# Patient Record
Sex: Female | Born: 1945 | Race: White | Hispanic: No | Marital: Single | Smoking: Never smoker
Health system: Southern US, Community
[De-identification: ages and names within clinical notes are randomized; demographics above are authoritative.]

## PROBLEM LIST (undated history)

## (undated) DIAGNOSIS — I69851 Hemiplegia and hemiparesis following other cerebrovascular disease affecting right dominant side: Secondary | ICD-10-CM

## (undated) DIAGNOSIS — R2681 Unsteadiness on feet: Secondary | ICD-10-CM

## (undated) DIAGNOSIS — R569 Unspecified convulsions: Secondary | ICD-10-CM

## (undated) DIAGNOSIS — Z87898 Personal history of other specified conditions: Secondary | ICD-10-CM

## (undated) DIAGNOSIS — R296 Repeated falls: Secondary | ICD-10-CM

## (undated) HISTORY — PX: APPENDECTOMY: SHX54

## (undated) HISTORY — PX: CHOLECYSTECTOMY: SHX55

---

## 2012-05-05 ENCOUNTER — Ambulatory Visit: Payer: Self-pay

## 2017-07-01 ENCOUNTER — Other Ambulatory Visit: Payer: Self-pay

## 2017-07-01 ENCOUNTER — Inpatient Hospital Stay
Admission: EM | Admit: 2017-07-01 | Discharge: 2017-07-05 | DRG: 872 | Disposition: A | Discharge status: Skilled Nursing Facility | Payer: Medicare Other | Attending: Internal Medicine | Admitting: Internal Medicine

## 2017-07-01 ENCOUNTER — Emergency Department: Payer: Medicare Other

## 2017-07-01 ENCOUNTER — Encounter: Payer: Self-pay | Admitting: *Deleted

## 2017-07-01 DIAGNOSIS — R319 Hematuria, unspecified: Secondary | ICD-10-CM | POA: Diagnosis present

## 2017-07-01 DIAGNOSIS — E669 Obesity, unspecified: Secondary | ICD-10-CM | POA: Diagnosis present

## 2017-07-01 DIAGNOSIS — E860 Dehydration: Secondary | ICD-10-CM | POA: Diagnosis not present

## 2017-07-01 DIAGNOSIS — Z7401 Bed confinement status: Secondary | ICD-10-CM

## 2017-07-01 DIAGNOSIS — Z880 Allergy status to penicillin: Secondary | ICD-10-CM | POA: Diagnosis not present

## 2017-07-01 DIAGNOSIS — N390 Urinary tract infection, site not specified: Secondary | ICD-10-CM

## 2017-07-01 DIAGNOSIS — R748 Abnormal levels of other serum enzymes: Secondary | ICD-10-CM | POA: Diagnosis present

## 2017-07-01 DIAGNOSIS — B964 Proteus (mirabilis) (morganii) as the cause of diseases classified elsewhere: Secondary | ICD-10-CM | POA: Diagnosis present

## 2017-07-01 DIAGNOSIS — A419 Sepsis, unspecified organism: Principal | ICD-10-CM | POA: Diagnosis present

## 2017-07-01 DIAGNOSIS — R05 Cough: Secondary | ICD-10-CM

## 2017-07-01 DIAGNOSIS — E871 Hypo-osmolality and hyponatremia: Secondary | ICD-10-CM

## 2017-07-01 DIAGNOSIS — N289 Disorder of kidney and ureter, unspecified: Secondary | ICD-10-CM

## 2017-07-01 DIAGNOSIS — Z91041 Radiographic dye allergy status: Secondary | ICD-10-CM

## 2017-07-01 DIAGNOSIS — Z9049 Acquired absence of other specified parts of digestive tract: Secondary | ICD-10-CM | POA: Diagnosis not present

## 2017-07-01 DIAGNOSIS — R059 Cough, unspecified: Secondary | ICD-10-CM

## 2017-07-01 DIAGNOSIS — E876 Hypokalemia: Secondary | ICD-10-CM | POA: Diagnosis present

## 2017-07-01 DIAGNOSIS — Z79899 Other long term (current) drug therapy: Secondary | ICD-10-CM

## 2017-07-01 DIAGNOSIS — I69851 Hemiplegia and hemiparesis following other cerebrovascular disease affecting right dominant side: Secondary | ICD-10-CM | POA: Diagnosis not present

## 2017-07-01 DIAGNOSIS — Z888 Allergy status to other drugs, medicaments and biological substances status: Secondary | ICD-10-CM

## 2017-07-01 DIAGNOSIS — R569 Unspecified convulsions: Secondary | ICD-10-CM | POA: Diagnosis present

## 2017-07-01 DIAGNOSIS — Z6837 Body mass index (BMI) 37.0-37.9, adult: Secondary | ICD-10-CM

## 2017-07-01 DIAGNOSIS — Z881 Allergy status to other antibiotic agents status: Secondary | ICD-10-CM | POA: Diagnosis not present

## 2017-07-01 HISTORY — DX: Repeated falls: R29.6

## 2017-07-01 HISTORY — DX: Personal history of other specified conditions: Z87.898

## 2017-07-01 HISTORY — DX: Unsteadiness on feet: R26.81

## 2017-07-01 HISTORY — DX: Hemiplegia and hemiparesis following other cerebrovascular disease affecting right dominant side: I69.851

## 2017-07-01 HISTORY — DX: Unspecified convulsions: R56.9

## 2017-07-01 LAB — COMPREHENSIVE METABOLIC PANEL
ALK PHOS: 170 U/L — AB (ref 38–126)
ALT: 13 U/L — AB (ref 14–54)
ANION GAP: 18 — AB (ref 5–15)
AST: 29 U/L (ref 15–41)
Albumin: 3 g/dL — ABNORMAL LOW (ref 3.5–5.0)
BUN: 32 mg/dL — ABNORMAL HIGH (ref 6–20)
CALCIUM: 8.7 mg/dL — AB (ref 8.9–10.3)
CHLORIDE: 82 mmol/L — AB (ref 101–111)
CO2: 31 mmol/L (ref 22–32)
CREATININE: 1.29 mg/dL — AB (ref 0.44–1.00)
GFR calc non Af Amer: 41 mL/min — ABNORMAL LOW (ref >60)
GFR, EST AFRICAN AMERICAN: 48 mL/min — AB (ref >60)
Glucose, Bld: 99 mg/dL (ref 65–99)
Potassium: 3 mmol/L — ABNORMAL LOW (ref 3.5–5.1)
SODIUM: 131 mmol/L — AB (ref 135–145)
Total Bilirubin: 1.3 mg/dL — ABNORMAL HIGH (ref 0.3–1.2)
Total Protein: 8.1 g/dL (ref 6.5–8.1)

## 2017-07-01 LAB — URINALYSIS, ROUTINE W REFLEX MICROSCOPIC
Bilirubin Urine: NEGATIVE
Glucose, UA: NEGATIVE mg/dL
KETONES UR: NEGATIVE mg/dL
NITRITE: POSITIVE — AB
PH: 8 (ref 5.0–8.0)
Protein, ur: NEGATIVE mg/dL
SPECIFIC GRAVITY, URINE: 1.006 (ref 1.005–1.030)

## 2017-07-01 LAB — CBC WITH DIFFERENTIAL/PLATELET
BASOS PCT: 0 %
Basophils Absolute: 0.1 10*3/uL (ref 0–0.1)
EOS ABS: 0.1 10*3/uL (ref 0–0.7)
Eosinophils Relative: 1 %
HCT: 42.7 % (ref 35.0–47.0)
HEMOGLOBIN: 14.4 g/dL (ref 12.0–16.0)
LYMPHS ABS: 1.5 10*3/uL (ref 1.0–3.6)
Lymphocytes Relative: 8 %
MCH: 31.8 pg (ref 26.0–34.0)
MCHC: 33.6 g/dL (ref 32.0–36.0)
MCV: 94.6 fL (ref 80.0–100.0)
MONO ABS: 1.7 10*3/uL — AB (ref 0.2–0.9)
MONOS PCT: 9 %
Neutro Abs: 16.2 10*3/uL — ABNORMAL HIGH (ref 1.4–6.5)
Neutrophils Relative %: 82 %
Platelets: 281 10*3/uL (ref 150–440)
RBC: 4.52 MIL/uL (ref 3.80–5.20)
RDW: 13.4 % (ref 11.5–14.5)
WBC: 19.6 10*3/uL — ABNORMAL HIGH (ref 3.6–11.0)

## 2017-07-01 LAB — TROPONIN I: Troponin I: <0.03 ng/mL (ref <0.03)

## 2017-07-01 LAB — LACTIC ACID, PLASMA
LACTIC ACID, VENOUS: 1.4 mmol/L (ref 0.5–1.9)
Lactic Acid, Venous: 1.8 mmol/L (ref 0.5–1.9)

## 2017-07-01 LAB — LIPASE, BLOOD: LIPASE: 65 U/L — AB (ref 11–51)

## 2017-07-01 MED ORDER — SODIUM CHLORIDE 0.9 % IV SOLN
INTRAVENOUS | Status: DC
  Administered 2017-07-02 (×2): via INTRAVENOUS

## 2017-07-01 MED ORDER — ACETAMINOPHEN 325 MG PO TABS
650.0000 mg | ORAL_TABLET | Freq: Four times a day (QID) | ORAL | Status: DC | PRN
  Administered 2017-07-03: 650 mg via ORAL
  Filled 2017-07-01: qty 2

## 2017-07-01 MED ORDER — MECLIZINE HCL 25 MG PO TABS
25.0000 mg | ORAL_TABLET | Freq: Three times a day (TID) | ORAL | Status: DC | PRN
  Filled 2017-07-01: qty 1

## 2017-07-01 MED ORDER — CARBAMAZEPINE 200 MG PO TABS: 200.0000 mg | ORAL_TABLET | Freq: Three times a day (TID) | ORAL | Status: DC

## 2017-07-01 MED ORDER — ONDANSETRON HCL 4 MG/2ML IJ SOLN
4.0000 mg | Freq: Four times a day (QID) | INTRAMUSCULAR | Status: DC | PRN
  Administered 2017-07-02: 4 mg via INTRAVENOUS
  Filled 2017-07-01: qty 2

## 2017-07-01 MED ORDER — FAMOTIDINE 20 MG PO TABS: 40.0000 mg | ORAL_TABLET | Freq: Two times a day (BID) | ORAL | Status: DC

## 2017-07-01 MED ORDER — PHENOBARBITAL 20 MG/5ML PO ELIX: 60.0000 mg | ORAL_SOLUTION | Freq: Two times a day (BID) | ORAL | Status: DC

## 2017-07-01 MED ORDER — PHENOBARBITAL 60 MG PO TABS: 60.0000 mg | ORAL_TABLET | Freq: Two times a day (BID) | ORAL | Status: DC

## 2017-07-01 MED ORDER — HYDROCODONE-ACETAMINOPHEN 5-325 MG PO TABS: 1.0000 | ORAL_TABLET | ORAL | Status: DC | PRN

## 2017-07-01 MED ORDER — CARBAMAZEPINE 200 MG PO TABS
400.0000 mg | ORAL_TABLET | Freq: Every day | ORAL | Status: DC
  Administered 2017-07-02 – 2017-07-04 (×4): 400 mg via ORAL
  Filled 2017-07-01 (×6): qty 2

## 2017-07-01 MED ORDER — IOPAMIDOL (ISOVUE-300) INJECTION 61%: 75.0000 mL | Freq: Once | INTRAVENOUS | Status: DC | PRN

## 2017-07-01 MED ORDER — ALBUTEROL SULFATE (2.5 MG/3ML) 0.083% IN NEBU
2.5000 mg | INHALATION_SOLUTION | RESPIRATORY_TRACT | Status: DC | PRN
  Administered 2017-07-05: 2.5 mg via RESPIRATORY_TRACT
  Filled 2017-07-01: qty 3

## 2017-07-01 MED ORDER — ACETAMINOPHEN 650 MG RE SUPP: 650.0000 mg | Freq: Four times a day (QID) | RECTAL | Status: DC | PRN

## 2017-07-01 MED ORDER — SODIUM CHLORIDE 0.9 % IV SOLN
2.0000 g | Freq: Once | INTRAVENOUS | Status: AC
  Administered 2017-07-01: 2 g via INTRAVENOUS
  Filled 2017-07-01: qty 2

## 2017-07-01 MED ORDER — CARBAMAZEPINE 200 MG PO TABS
200.0000 mg | ORAL_TABLET | Freq: Two times a day (BID) | ORAL | Status: DC
  Administered 2017-07-02 – 2017-07-05 (×8): 200 mg via ORAL
  Filled 2017-07-01 (×8): qty 1

## 2017-07-01 MED ORDER — BISACODYL 5 MG PO TBEC: 5.0000 mg | DELAYED_RELEASE_TABLET | Freq: Every day | ORAL | Status: DC | PRN

## 2017-07-01 MED ORDER — SODIUM CHLORIDE 0.9 % IV SOLN
1.0000 g | Freq: Three times a day (TID) | INTRAVENOUS | Status: DC
  Administered 2017-07-02 – 2017-07-03 (×5): 1 g via INTRAVENOUS
  Filled 2017-07-01 (×6): qty 1

## 2017-07-01 MED ORDER — SODIUM CHLORIDE 0.9 % IV BOLUS (SEPSIS)
1000.0000 mL | INTRAVENOUS | Status: AC
  Administered 2017-07-01: 1000 mL via INTRAVENOUS

## 2017-07-01 MED ORDER — PROMETHAZINE HCL 25 MG PO TABS
25.0000 mg | ORAL_TABLET | Freq: Four times a day (QID) | ORAL | Status: DC | PRN
  Filled 2017-07-01: qty 1

## 2017-07-01 MED ORDER — SENNOSIDES-DOCUSATE SODIUM 8.6-50 MG PO TABS: 1.0000 | ORAL_TABLET | Freq: Every evening | ORAL | Status: DC | PRN

## 2017-07-01 MED ORDER — ONDANSETRON HCL 4 MG/2ML IJ SOLN
4.0000 mg | INTRAMUSCULAR | Status: AC
  Filled 2017-07-01: qty 2

## 2017-07-01 MED ORDER — VITAMIN B-12 1000 MCG PO TABS
500.0000 ug | ORAL_TABLET | Freq: Every day | ORAL | Status: DC
  Administered 2017-07-02 – 2017-07-05 (×4): 500 ug via ORAL
  Filled 2017-07-01 (×4): qty 1

## 2017-07-01 MED ORDER — LORATADINE 10 MG PO TABS: 10.0000 mg | ORAL_TABLET | Freq: Every day | ORAL | Status: DC | PRN

## 2017-07-01 MED ORDER — ENOXAPARIN SODIUM 40 MG/0.4ML ~~LOC~~ SOLN
40.0000 mg | SUBCUTANEOUS | Status: DC
  Administered 2017-07-02 – 2017-07-04 (×4): 40 mg via SUBCUTANEOUS
  Filled 2017-07-01 (×4): qty 0.4

## 2017-07-01 MED ORDER — PHENOBARBITAL 60 MG/ML ORAL SUSPENSION: 60.0000 mg | Freq: Two times a day (BID) | ORAL | Status: DC

## 2017-07-01 MED ORDER — ONDANSETRON HCL 4 MG PO TABS
4.0000 mg | ORAL_TABLET | Freq: Four times a day (QID) | ORAL | Status: DC | PRN
  Filled 2017-07-01: qty 1

## 2017-07-01 MED ORDER — GUAIFENESIN 100 MG/5ML PO SOLN
100.0000 mg | ORAL | Status: DC | PRN
  Administered 2017-07-05: 100 mg via ORAL
  Filled 2017-07-01 (×2): qty 5

## 2017-07-01 MED ORDER — POTASSIUM CHLORIDE CRYS ER 20 MEQ PO TBCR
20.0000 meq | EXTENDED_RELEASE_TABLET | Freq: Every day | ORAL | Status: DC
  Administered 2017-07-02 – 2017-07-05 (×4): 20 meq via ORAL
  Filled 2017-07-01 (×4): qty 1

## 2017-07-01 MED ORDER — FOLIC ACID 1 MG PO TABS
1.0000 mg | ORAL_TABLET | Freq: Every day | ORAL | Status: DC
  Administered 2017-07-02 – 2017-07-05 (×4): 1 mg via ORAL
  Filled 2017-07-01 (×4): qty 1

## 2017-07-01 MED ORDER — FAMOTIDINE 20 MG PO TABS
20.0000 mg | ORAL_TABLET | Freq: Two times a day (BID) | ORAL | Status: DC
  Administered 2017-07-02 – 2017-07-05 (×7): 20 mg via ORAL
  Filled 2017-07-01 (×8): qty 1

## 2017-07-01 MED ORDER — LOPERAMIDE HCL 2 MG PO CAPS
2.0000 mg | ORAL_CAPSULE | ORAL | Status: DC | PRN
  Filled 2017-07-01: qty 1

## 2017-07-01 NOTE — ED Provider Notes (Signed)
[facility] Emergency Department Provider Note  ____________________________________________   First MD Initiated Contact with Patient 07/01/17 1439     (approximate)  I have reviewed the triage vital signs and the nursing notes.   HISTORY  Chief Complaint Emesis  Level 5 caveat:  history/ROS limited by acute/critical illness  HPI Sherri Sweeney is a 72 y.o. female with medical history as listed below who is chronically ill and lives at [facility] who presents by EMS for evaluation of possible dehydration and possible urinary tract infection.  Reportedly she has had gradually worsening symptoms over the last week that include burning with urination, increased lethargy, increased generalized weakness, inability to get out of bed even with a lift, and altered mental status with increased confusion.  She also reports that she generalized abdominal pain and occasionally has shortness of breath.  She and her family, who provide most of the history, reports that the symptoms are severe and nothing is making them better or worse.  She says that she feels terrible all over but is unable to describe any other symptoms except as described above.  There is no report of fever, chest pain, nor shortness of breath except with exertion.   Past Medical History:  Diagnosis Date  . Falling   . 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 History of convulsions   . Unspecified convulsions (HCC)   . Unsteadiness on feet     Patient Active Problem List   Diagnosis Date Noted  . Sepsis (HCC) 07/01/2017    Past Surgical History:  Procedure Laterality Date  . APPENDECTOMY    . CHOLECYSTECTOMY      Prior to Admission medications   Medication Sig Start Date End Date Taking? Authorizing Provider  acetaminophen (TYLENOL) 500 MG tablet Take 1,000 mg by mouth every 8 (eight) hours as needed.   Yes [provider]  carbamazepine (TEGRETOL) 200 MG  tablet Take 200-400 mg by mouth 3 (three) times daily. Take 200 mg by mouth morning and lunch. Take 400 mg by mouth at bedtime.   Yes [provider]  Cholecalciferol (D3-1000 PO) Take 1,000 Units by mouth daily.   Yes [provider]  folic acid (FOLVITE) 1 MG tablet Take 1 mg by mouth daily.   Yes [provider]  guaiFENesin (MUCINEX) 600 MG 12 hr tablet Take 600 mg by mouth 2 (two) times daily as needed (congestion).   Yes [provider]  guaiFENesin (ROBITUSSIN) 100 MG/5ML liquid Take 100 mg by mouth every 4 (four) hours as needed for cough.   Yes [provider]  loperamide (IMODIUM) 2 MG capsule Take 2 mg by mouth as needed for diarrhea or loose stools.   Yes [provider]  loratadine (CLARITIN) 10 MG tablet Take 10 mg by mouth daily as needed for allergies.   Yes [provider]  meclizine (ANTIVERT) 25 MG tablet Take 25 mg by mouth 3 (three) times daily as needed for dizziness.   Yes [provider]  ondansetron (ZOFRAN) 4 MG tablet Take 4 mg by mouth every 6 (six) hours as needed for nausea or vomiting.   Yes [provider]  PHENObarbital (LUMINAL) 60 MG tablet Take 60 mg by mouth 2 (two) times daily.   Yes [provider]  potassium chloride SA (K-DUR,KLOR-CON) 20 MEQ tablet Take 20 mEq by mouth daily.   Yes [provider]  promethazine (PHENERGAN) 25 MG tablet Take 25  mg by mouth every 6 (six) hours as needed for nausea or vomiting.   Yes [provider]  ranitidine (ZANTAC) 300 MG tablet Take 300 mg by mouth 2 (two) times daily.   Yes [provider]  torsemide (DEMADEX) 100 MG tablet Take 100 mg by mouth daily.   Yes [provider]  vitamin B-12 (CYANOCOBALAMIN) 500 MCG tablet Take 500 mcg by mouth daily.   Yes [provider]    Allergies Ampicillin; Ceftin [cefuroxime axetil]; Contrast media [iodinated diagnostic agents]; Keflex [cephalexin];  Levaquin [levofloxacin in d5w]; Nitrofurantoin; Septra [sulfamethoxazole-trimethoprim]; Vancomycin; and Penicillins  No family history on file.  Social History Social History   Tobacco Use  . Smoking status: Never Smoker  . Smokeless tobacco: Never Used  Substance Use Topics  . Alcohol use: No    Frequency: Never  . Drug use: No    Review of Systems Level 5 caveat:  history/ROS limited by acute/critical illness and confusion ____________________________________________   PHYSICAL EXAM:  VITAL SIGNS: ED Triage Vitals  Enc Vitals Group     BP 07/01/17 1357 120/77     Pulse Rate 07/01/17 1357 (!) 102     Resp 07/01/17 1357 18     Temp 07/01/17 1357 97.7 F (36.5 C)     Temp Source 07/01/17 1357 Oral     SpO2 07/01/17 1357 95 %     Weight 07/01/17 1358 103.9 kg (229 lb 0.9 oz)     Height --      Head Circumference --      Peak Flow --      Pain Score --      Pain Loc --      Pain Edu? --      Excl. in GC? --     Constitutional: Alert but appears chronically ill, possibly acutely as well, if occult to appreciate the degree of acute on chronic symptoms upon initial evaluation Eyes: Conjunctivae are normal.  Head: Atraumatic. Nose: No congestion/rhinnorhea. Mouth/Throat: Mucous membranes are dry Neck: No stridor.  No meningeal signs.   Cardiovascular: Mild tachycardia, regular rhythm. Good peripheral circulation. Grossly normal heart sounds. Respiratory: Normal respiratory effort.  No retractions. Lungs CTAB. Gastrointestinal: Morbid obesity.  Soft but moderate tenderness throughout the abdomen, possibly some distention but difficult to appreciate given body habitus.  No guarding, questionable rebound Musculoskeletal: No lower extremity tenderness nor edema. No gross deformities of extremities. Neurologic: No acute facial droop.  Chronic right-sided hemiplegia about left side is moving normally. Skin:  Skin is pale, warm, dry and intact. No rash noted. Psychiatric:  Mood and affect are normal. Speech and behavior are normal.  ____________________________________________   LABS (all labs ordered are listed, but only abnormal results are displayed)  Labs Reviewed  CBC WITH DIFFERENTIAL/PLATELET - Abnormal; Notable for the following components:      Result Value   WBC 19.6 (*)    Neutro Abs 16.2 (*)    Monocytes Absolute 1.7 (*)    All other components within normal limits  LIPASE, BLOOD - Abnormal; Notable for the following components:   Lipase 65 (*)    All other components within normal limits  COMPREHENSIVE METABOLIC PANEL - Abnormal; Notable for the following components:   Sodium 131 (*)    Potassium 3.0 (*)    Chloride 82 (*)    BUN 32 (*)    Creatinine, Ser 1.29 (*)    Calcium 8.7 (*)    Albumin 3.0 (*)    ALT  13 (*)    Alkaline Phosphatase 170 (*)    Total Bilirubin 1.3 (*)    GFR calc non Af Amer 41 (*)    GFR calc Af Amer 48 (*)    Anion gap 18 (*)    All other components within normal limits  URINALYSIS, ROUTINE W REFLEX MICROSCOPIC - Abnormal; Notable for the following components:   Color, Urine YELLOW (*)    APPearance TURBID (*)    Hgb urine dipstick SMALL (*)    Nitrite POSITIVE (*)    Leukocytes, UA LARGE (*)    Bacteria, UA MANY (*)    Squamous Epithelial / LPF 6-30 (*)    All other components within normal limits  URINE CULTURE  TROPONIN I  LACTIC ACID, PLASMA  LACTIC ACID, PLASMA   ____________________________________________  EKG  ED ECG REPORT I, Loleta Rose, the attending physician, personally viewed and interpreted this ECG.  Date: 07/01/2017 EKG Time: 16:45 Rate: 102 Rhythm: borderline tachycardia QRS Axis: normal Intervals: RBBB ST/T Wave abnormalities: Non-specific ST segment / T-wave changes, but no evidence of acute ischemia. Narrative Interpretation: no evidence of acute ischemia   ____________________________________________  RADIOLOGY   ED MD interpretation:  No acute abnormalities.  Tiny renal stone not concerning even in the setting of UTI  Official radiology report(s): Ct Abdomen Pelvis Wo Contrast  Result Date: 07/01/2017 CLINICAL DATA:  Dehydration and difficulty urinating EXAM: CT ABDOMEN AND PELVIS WITHOUT CONTRAST TECHNIQUE: Multidetector CT imaging of the abdomen and pelvis was performed following the standard protocol without IV contrast. COMPARISON:  None. FINDINGS: Lower chest: No acute abnormality. Hepatobiliary: Fatty infiltration of the liver. Status post cholecystectomy. No biliary dilatation. Pancreas: Fatty infiltration of the pancreas is seen. No inflammatory changes are noted. Spleen: Normal in size without focal abnormality. Adrenals/Urinary Tract: Adrenal glands are within normal limits. Kidneys are well visualized bilaterally. Tiny nonobstructing right upper pole renal stone is noted. The bladder is within normal limits. Stomach/Bowel: Appendix is within normal limits. No obstructive or inflammatory changes of the bowel are seen. Vascular/Lymphatic: Aortic atherosclerosis. No enlarged abdominal or pelvic lymph nodes. Reproductive: Uterus and bilateral adnexa are unremarkable. Other: No abdominal wall hernia or abnormality. No abdominopelvic ascites. Musculoskeletal: Degenerative changes of the lumbar spine are noted. IMPRESSION: Tiny nonobstructing right renal stone Chronic changes in the liver and pancreas. No acute abnormality noted. Electronically Signed   By: Alcide Clever M.D.   On: 07/01/2017 16:47    ____________________________________________   PROCEDURES  Critical Care performed: No   Procedure(s) performed:   Procedures   ____________________________________________   INITIAL IMPRESSION / ASSESSMENT AND PLAN / ED COURSE  As part of my medical decision making, I reviewed the following data within the electronic MEDICAL RECORD NUMBER Nursing notes reviewed and incorporated, Labs reviewed , EKG interpreted  and Discussed with admitting physician      Differential diagnosis includes, but is not limited to, acute infection including viral respiratory illness, pneumonia, intra-abdominal infection, urinary tract infection; generalized dehydration and failure to thrive; CVA less likely given the current presentation; ACS.  The patient's presentation looks most consistent with urinary tract infection with the possibility of additional intra-abdominal infection as well.  She reports an allergy to IV contrast so I am obtaining a CT scan of this with no contrast.  Lab work is pending.  Based on the appearance of the patient and the description of her symptoms I suspect she will but I will reassess after the workup.  Clinical Course as of  Jul 01 2105  Mon Jul 01, 2017  1602 Anion gap: (!) 18 [CF]  1602 Creatinine: (!) 1.29 [CF]  1603 Potassium: (!) 3.0 [CF]  1603 Lipase: (!) 65 [CF]  1603 WBC: (!) 19.6 [CF]  1731 Lactic Acid, Venous: 1.8 [CF]  1731 No acute abnormality on CT abd/pelvis.  Awaiting UA CT ABDOMEN PELVIS WO CONTRAST [CF]  1919 Grossly infected nitrite positive urine with hematuria.  Again, CT was reassuring.  Extensive antibiotic allergies.  Will treat with PCN-allergic alternative regiment and admit given AMS, leukocytosis, failure to thrive, etc  [CF]  1930 Updated patient and family.  Discussed case by phone with Dr. Imogene Burn who will admit.  [CF]    Clinical Course User Index [CF] Loleta Rose, MD    ____________________________________________  FINAL CLINICAL IMPRESSION(S) / ED DIAGNOSES  Final diagnoses:  Urinary tract infection with hematuria, site unspecified  Hypokalemia  Acute renal insufficiency  Dehydration  Elevated lipase  Hyponatremia     MEDICATIONS GIVEN DURING THIS VISIT:  Medications  ondansetron (ZOFRAN) injection 4 mg (4 mg Intravenous Refused 07/01/17 1515)  aztreonam (AZACTAM) 1 g in sodium chloride 0.9 % 100 mL IVPB (not administered)  sodium chloride 0.9 % bolus 1,000 mL (0 mLs Intravenous  Stopped 07/01/17 1933)  aztreonam (AZACTAM) 2 g in sodium chloride 0.9 % 100 mL IVPB (0 g Intravenous Stopped 07/01/17 2047)     ED Discharge Orders    None       Note:  This document was prepared using Dragon voice recognition software and may include unintentional dictation errors.    Loleta Rose, MD 07/01/17 2108

## 2017-07-01 NOTE — H&P (Signed)
[employer] [facility] [place] [facility]   PATIENT NAME: Sherri Sweeney    MR#:  [id number]  DATE OF BIRTH:  [birth year]  DATE OF ADMISSION:  07/01/2017  PRIMARY CARE PHYSICIAN: Dortha Kern, MD   REQUESTING/REFERRING PHYSICIAN: Dr. York Cerise.  CHIEF COMPLAINT:   Chief Complaint  Patient presents with  . Emesis   Dysuria, lethargy and generalized weakness for the past week HISTORY OF PRESENT ILLNESS:  Sherri Sweeney  is a 72 y.o. female with a known history of convulsions, hemiplegia and fall.  The patient was sent from nursing home to the ED due to above chief complaints.  She is found tachycardia, leukocytosis and UTI in the ED.  Since she has allergy to multiple antibiotics, azactam is started.  PAST MEDICAL HISTORY:   Past Medical History:  Diagnosis Date  . Falling   . 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 History of convulsions   . Unspecified convulsions (HCC)   . Unsteadiness on feet     PAST SURGICAL HISTORY:   Past Surgical History:  Procedure Laterality Date  . APPENDECTOMY    . CHOLECYSTECTOMY      SOCIAL HISTORY:   Social History   Tobacco Use  . Smoking status: Never Smoker  . Smokeless tobacco: Never Used  Substance Use Topics  . Alcohol use: No    Frequency: Never    FAMILY HISTORY:  No family history on file.  DRUG ALLERGIES:   Allergies  Allergen Reactions  . Ampicillin   . Ceftin [Cefuroxime Axetil]   . Contrast Media [Iodinated Diagnostic Agents] Itching  . Keflex [Cephalexin]   . Levaquin [Levofloxacin In D5w]   . Nitrofurantoin   . Septra [Sulfamethoxazole-Trimethoprim]   . Vancomycin   . Penicillins Itching and Rash    REVIEW OF SYSTEMS:   Review of Systems  Constitutional: Positive for malaise/fatigue. Negative for chills and fever.  HENT: Negative for sore throat.   Eyes: Negative for blurred vision and double vision.  Respiratory: Negative for cough, hemoptysis, shortness of  breath, wheezing and stridor.   Cardiovascular: Negative for chest pain, palpitations, orthopnea and leg swelling.  Gastrointestinal: Negative for abdominal pain, blood in stool, diarrhea, melena, nausea and vomiting.  Genitourinary: Positive for dysuria and frequency. Negative for flank pain and hematuria.  Musculoskeletal: Negative for back pain and joint pain.  Neurological: Positive for weakness. Negative for dizziness, sensory change, focal weakness, seizures, loss of consciousness and headaches.  Endo/Heme/Allergies: Negative for polydipsia.  Psychiatric/Behavioral: Negative for depression. The patient is not nervous/anxious.     MEDICATIONS AT HOME:   Prior to Admission medications   Medication Sig Start Date End Date Taking? Authorizing Provider  acetaminophen (TYLENOL) 500 MG tablet Take 1,000 mg by mouth every 8 (eight) hours as needed.   Yes [provider]  carbamazepine (TEGRETOL) 200 MG tablet Take 200-400 mg by mouth 3 (three) times daily. Take 200 mg by mouth morning and lunch. Take 400 mg by mouth at bedtime.   Yes [provider]  Cholecalciferol (D3-1000 PO) Take 1,000 Units by mouth daily.   Yes [provider]  folic acid (FOLVITE) 1 MG tablet Take 1 mg by mouth daily.   Yes [provider]  guaiFENesin (MUCINEX) 600 MG 12 hr tablet Take 600 mg by mouth 2 (two) times daily as needed (congestion).   Yes [provider]  guaiFENesin (ROBITUSSIN) 100 MG/5ML liquid Take 100 mg by mouth every 4 (  four) hours as needed for cough.   Yes [provider]  loperamide (IMODIUM) 2 MG capsule Take 2 mg by mouth as needed for diarrhea or loose stools.   Yes [provider]  loratadine (CLARITIN) 10 MG tablet Take 10 mg by mouth daily as needed for allergies.   Yes [provider]  meclizine (ANTIVERT) 25 MG tablet Take 25 mg by mouth 3 (three) times daily as needed for dizziness.   Yes [provider]    ondansetron (ZOFRAN) 4 MG tablet Take 4 mg by mouth every 6 (six) hours as needed for nausea or vomiting.   Yes [provider]  PHENObarbital (LUMINAL) 60 MG tablet Take 60 mg by mouth 2 (two) times daily.   Yes [provider]  potassium chloride SA (K-DUR,KLOR-CON) 20 MEQ tablet Take 20 mEq by mouth daily.   Yes [provider]  promethazine (PHENERGAN) 25 MG tablet Take 25 mg by mouth every 6 (six) hours as needed for nausea or vomiting.   Yes [provider]  ranitidine (ZANTAC) 300 MG tablet Take 300 mg by mouth 2 (two) times daily.   Yes [provider]  torsemide (DEMADEX) 100 MG tablet Take 100 mg by mouth daily.   Yes [provider]  vitamin B-12 (CYANOCOBALAMIN) 500 MCG tablet Take 500 mcg by mouth daily.   Yes [provider]      VITAL SIGNS:  Blood pressure 117/73, pulse (!) 106, temperature 97.7 F (36.5 C), temperature source Oral, resp. rate 14, weight 229 lb 0.9 oz (103.9 kg), SpO2 96 %.  PHYSICAL EXAMINATION:  Physical Exam  GENERAL:  72 y.o.-year-old patient lying in the bed with no acute distress.  Obesity. EYES: Pupils equal, round, reactive to light and accommodation. No scleral icterus. Extraocular muscles intact.  HEENT: Head atraumatic, normocephalic. Oropharynx and nasopharynx clear.  NECK:  Supple, no jugular venous distention. No thyroid enlargement, no tenderness.  LUNGS: Normal breath sounds bilaterally, no wheezing, rales,rhonchi or crepitation. No use of accessory muscles of respiration.  CARDIOVASCULAR: S1, S2 normal. No murmurs, rubs, or gallops.  ABDOMEN: Soft, tenderness in the lower abdomen, nondistended. Bowel sounds present. No organomegaly or mass.  EXTREMITIES: No pedal edema, cyanosis, or clubbing.  NEUROLOGIC: Cranial nerves II through XII are intact. Muscle strength 3-4/5 in all extremities. Sensation intact. Gait not checked.  PSYCHIATRIC: The patient is alert and oriented x 2-3.   SKIN: No obvious rash, lesion, or ulcer.   LABORATORY PANEL:   CBC Recent Labs  Lab 07/01/17 1511  WBC 19.6*  HGB 14.4  HCT 42.7  PLT 281   ------------------------------------------------------------------------------------------------------------------  Chemistries  Recent Labs  Lab 07/01/17 1511  NA 131*  K 3.0*  CL 82*  CO2 31  GLUCOSE 99  BUN 32*  CREATININE 1.29*  CALCIUM 8.7*  AST 29  ALT 13*  ALKPHOS 170*  BILITOT 1.3*   ------------------------------------------------------------------------------------------------------------------  Cardiac Enzymes Recent Labs  Lab 07/01/17 1511  TROPONINI <0.03   ------------------------------------------------------------------------------------------------------------------  RADIOLOGY:  Ct Abdomen Pelvis Wo Contrast  Result Date: 07/01/2017 CLINICAL DATA:  Dehydration and difficulty urinating EXAM: CT ABDOMEN AND PELVIS WITHOUT CONTRAST TECHNIQUE: Multidetector CT imaging of the abdomen and pelvis was performed following the standard protocol without IV contrast. COMPARISON:  None. FINDINGS: Lower chest: No acute abnormality. Hepatobiliary: Fatty infiltration of the liver. Status post cholecystectomy. No biliary dilatation. Pancreas: Fatty infiltration of the pancreas is seen. No inflammatory changes are noted. Spleen: Normal in size without focal abnormality. Adrenals/Urinary  Tract: Adrenal glands are within normal limits. Kidneys are well visualized bilaterally. Tiny nonobstructing right upper pole renal stone is noted. The bladder is within normal limits. Stomach/Bowel: Appendix is within normal limits. No obstructive or inflammatory changes of the bowel are seen. Vascular/Lymphatic: Aortic atherosclerosis. No enlarged abdominal or pelvic lymph nodes. Reproductive: Uterus and bilateral adnexa are unremarkable. Other: No abdominal wall hernia or abnormality. No abdominopelvic ascites. Musculoskeletal: Degenerative changes  of the lumbar spine are noted. IMPRESSION: Tiny nonobstructing right renal stone Chronic changes in the liver and pancreas. No acute abnormality noted. Electronically Signed   By: Alcide Clever M.D.   On: 07/01/2017 16:47      IMPRESSION AND PLAN:   Sepsis due to UTI (tachycardia and leukocytosis) The patient will be admitted to medical floor. She has allergies to multiple antibiotics.  Start attacked him and ID consult. Follow-up CBC and cultures.  Acute renal failure with dehydration.  Start IV fluid support and follow-up BMP.  Hold torsemide.  History of convulsions.  Continue Tegretol. All the records are reviewed and case discussed with ED provider. Management plans discussed with the patient, her sister and they are in agreement.  CODE STATUS: Full code  TOTAL TIME TAKING CARE OF THIS PATIENT: 56 minutes.    Shaune Pollack M.D on 07/01/2017 at 7:51 PM  Between 7am to 6pm - Pager - [phone]  After 6pm go to [email or web address] - Social research officer, government  [facility] [place] [facility]  Office  [phone]  CC: Primary care physician; Dortha Kern, MD   Note: This dictation was prepared with Dragon dictation along with smaller phrase technology. Any transcriptional errors that result from this process are unin

## 2017-07-01 NOTE — ED Notes (Signed)
Dr. Chen at bedside.

## 2017-07-01 NOTE — ED Triage Notes (Signed)
Per EMS report, Patient c/o emesis for one week. Patient is a resident of [facility] and has had poor PO intake for 2-3 days.

## 2017-07-01 NOTE — ED Notes (Addendum)
Per patient's sister report, the nurse at [facility] sent the patient over for possible dehydration and burning with urination. Patient did not receive tegretol today, either generic or name brand. Family states patient hasn't been nauseous since Thursday. Patient placed on bed pan, but urinated in front of the pan. Dr. York CeriseForbach aware.

## 2017-07-01 NOTE — Progress Notes (Signed)
Pharmacy Antibiotic Note  Rudie Meyerancy L Dyas is a 72 y.o. female admitted on 07/01/2017 with UTI.  Pharmacy has been consulted for aztreonam dosing.  Plan: Aztreonam 2 g IV once in ED tonight. Will order aztreonam 1 g IV q8h  Weight: 229 lb 0.9 oz (103.9 kg)  Temp (24hrs), Avg:97.7 F (36.5 C), Min:97.7 F (36.5 C), Max:97.7 F (36.5 C)  Recent Labs  Lab 07/01/17 1511 07/01/17 1611  WBC 19.6*  --   CREATININE 1.29*  --   LATICACIDVEN  --  1.8    CrCl cannot be calculated (Unknown ideal weight.).    Allergies  Allergen Reactions  . Ampicillin   . Ceftin [Cefuroxime Axetil]   . Contrast Media [Iodinated Diagnostic Agents] Itching  . Keflex [Cephalexin]   . Levaquin [Levofloxacin In D5w]   . Nitrofurantoin   . Septra [Sulfamethoxazole-Trimethoprim]   . Vancomycin   . Penicillins Itching and Rash   Antimicrobials this admission: aztreonam 2/11 >>   Dose adjustments this admission:  Microbiology results: 2/11 UCx: Sent    Thank you for allowing pharmacy to be a part of this patient's care.  Cindi CarbonMary M Olamide Lahaie, PharmD, BCPS Clinical Pharmacist 07/01/2017 8:00 PM

## 2017-07-01 NOTE — ED Notes (Signed)
External female catheter placed prior to transfer to floor.

## 2017-07-02 ENCOUNTER — Other Ambulatory Visit: Payer: Self-pay

## 2017-07-02 LAB — BASIC METABOLIC PANEL
ANION GAP: 9 (ref 5–15)
BUN: 23 mg/dL — ABNORMAL HIGH (ref 6–20)
CO2: 29 mmol/L (ref 22–32)
Calcium: 7.6 mg/dL — ABNORMAL LOW (ref 8.9–10.3)
Chloride: 93 mmol/L — ABNORMAL LOW (ref 101–111)
Creatinine, Ser: 0.96 mg/dL (ref 0.44–1.00)
GFR calc Af Amer: >60 mL/min (ref >60)
GFR calc non Af Amer: 59 mL/min — ABNORMAL LOW (ref >60)
GLUCOSE: 92 mg/dL (ref 65–99)
POTASSIUM: 3.1 mmol/L — AB (ref 3.5–5.1)
Sodium: 131 mmol/L — ABNORMAL LOW (ref 135–145)

## 2017-07-02 LAB — CBC
HEMATOCRIT: 35.5 % (ref 35.0–47.0)
HEMOGLOBIN: 11.9 g/dL — AB (ref 12.0–16.0)
MCH: 31.8 pg (ref 26.0–34.0)
MCHC: 33.6 g/dL (ref 32.0–36.0)
MCV: 94.6 fL (ref 80.0–100.0)
Platelets: 196 10*3/uL (ref 150–440)
RBC: 3.75 MIL/uL — ABNORMAL LOW (ref 3.80–5.20)
RDW: 13.4 % (ref 11.5–14.5)
WBC: 10.2 10*3/uL (ref 3.6–11.0)

## 2017-07-02 LAB — APTT: APTT: 50 s — AB (ref 24–36)

## 2017-07-02 LAB — PROCALCITONIN

## 2017-07-02 LAB — PROTIME-INR
INR: 1.34
PROTHROMBIN TIME: 16.5 s — AB (ref 11.4–15.2)

## 2017-07-02 LAB — MAGNESIUM: Magnesium: 1.9 mg/dL (ref 1.7–2.4)

## 2017-07-02 LAB — MRSA PCR SCREENING: MRSA BY PCR: NEGATIVE

## 2017-07-02 MED ORDER — POTASSIUM CHLORIDE IN NACL 20-0.9 MEQ/L-% IV SOLN
INTRAVENOUS | Status: DC
  Administered 2017-07-02 – 2017-07-04 (×5): via INTRAVENOUS
  Filled 2017-07-02 (×8): qty 1000

## 2017-07-02 MED ORDER — PHENOBARBITAL 20 MG/5ML PO ELIX
60.0000 mg | ORAL_SOLUTION | Freq: Two times a day (BID) | ORAL | Status: DC
  Administered 2017-07-02 – 2017-07-05 (×8): 60 mg via ORAL
  Filled 2017-07-02 (×8): qty 15

## 2017-07-02 NOTE — Plan of Care (Signed)
  Health Behavior/Discharge Planning: Ability to manage health-related needs will improve 07/02/2017 1319 - Progressing by Tomie ChinaJackson, Vihan Santagata Cecelie, RN

## 2017-07-02 NOTE — Clinical Social Work Note (Addendum)
Clinical Social Work Assessment  Patient Details  Name: Sherri Sweeney MRN: [id number] Date of Birth: 1945-08-30  Date of referral:  07/02/17               Reason for consult:  Facility Placement, Discharge Planning                Permission sought to share information with:  Chartered certified accountant granted to share information::  Yes, Verbal Permission Granted  Name::      [place]::   [place]  Relationship::     Contact Information:     Housing/Transportation Living arrangements for the past 2 months:  [place] of Information:  Siblings Patient Interpreter Needed:  None Criminal Activity/Legal Involvement Pertinent to Current Situation/Hospitalization:  No - Comment as needed Significant Relationships:  Siblings Lives with:  Facility Resident Do you feel safe going back to the place where you live?  Yes Need for family participation in patient care:  Yes (Comment)  Care giving concerns: Patient is a long term care SNF resident at [facility].   Social Worker assessment / plan: Holiday representative (CSW) reviewed patient's chart and noted that she is from [place]. Social work Theatre manager met with patient alone at bedside. Patient was sitting up in bed alert and oriented to self, place, and time. Social work Theatre manager introduced self and explained the role of the [place]. Patient requested that social work intern call sister to complete assessment. Social work Theatre manager contacted patient's sister Sherri Sweeney [phone]), introduced self and explained the role of the [place] department. Per Sherri Sweeney patient has been a resident at [facility] for about 3 years and uses wheelchair at baseline. Per Sherri Sweeney patient can return when discharged. FL2 completed. CSW and social work Theatre manager will continue to follow up and assist.  Per [employer] at [employer] patient is a long term care SNF resident and can return  when stable.   Employment status:    Forensic scientist:  Medicaid In [place], [place] PT Recommendations:  Not assessed at this time Information / Referral to community resources:  [place]  Patient/Family's Response to care: Patient's sister is agreeable for patient to return to [employer].  Patient/Family's Understanding of and Emotional Response to Diagnosis, Current Treatment, and Prognosis:  Patient and patient's sister were both pleasant and thanked social work Theatre manager for her assistance.  Emotional Assessment Appearance:  Appears stated age Attitude/Demeanor/Rapport:    Affect (typically observed):  Quiet, Pleasant, Calm Orientation:  Oriented to Self, Oriented to Place, Oriented to  Time Alcohol / Substance use:  Not Applicable Psych involvement (Current and /or in the community):  No (Comment)  Discharge Needs  Concerns to be addressed:  Care Coordination, Discharge Planning Concerns Readmission within the last 30 days:  No Current discharge risk:  Dependent with Mobility Barriers to Discharge:  Continued Medical Work up   Smith Mince, Student-Social Work 07/02/2017, 11:21 AM

## 2017-07-02 NOTE — Progress Notes (Signed)
[place] at [place] NAME: Sherri Sweeney    MR#:  [id number]  DATE OF BIRTH:  [birth year]  SUBJECTIVE:   Pt. Here due to Sepsis due to UTI.  Poor historian.  No complaints presently.   REVIEW OF SYSTEMS:    Review of Systems  Constitutional: Negative for chills and fever.  HENT: Negative for congestion and tinnitus.   Eyes: Negative for blurred vision and double vision.  Respiratory: Negative for cough, shortness of breath and wheezing.   Cardiovascular: Negative for chest pain, orthopnea and PND.  Gastrointestinal: Negative for abdominal pain, diarrhea, nausea and vomiting.  Genitourinary: Negative for dysuria and hematuria.  Neurological: Negative for dizziness, sensory change and focal weakness.  All other systems reviewed and are negative.   Nutrition: Heart healthy Tolerating Diet: Yes Tolerating PT: Await Eval.    DRUG ALLERGIES:   Allergies  Allergen Reactions  . Ampicillin   . Ceftin [Cefuroxime Axetil]   . Contrast Media [Iodinated Diagnostic Agents] Itching  . Keflex [Cephalexin]   . Levaquin [Levofloxacin In D5w]   . Nitrofurantoin   . Septra [Sulfamethoxazole-Trimethoprim]   . Vancomycin   . Penicillins Itching and Rash    VITALS:  Blood pressure (!) 112/58, pulse 84, temperature 98.5 F (36.9 C), temperature source Oral, resp. rate 16, height _0  (1.626 m), weight 98.2 kg (216 lb 9.6 oz), SpO2 99 %.  PHYSICAL EXAMINATION:   Physical Exam  GENERAL:  72 y.o.-year-old patient lying in bed in no acute distress.  EYES: Pupils equal, round, reactive to light and accommodation. No scleral icterus. Extraocular muscles intact.  HEENT: Head atraumatic, normocephalic. Oropharynx and nasopharynx clear.  NECK:  Supple, no jugular venous distention. No thyroid enlargement, no tenderness.  LUNGS: Normal breath sounds bilaterally, no wheezing, rales, rhonchi. No use of accessory muscles of respiration.  CARDIOVASCULAR: S1,  S2 normal. No murmurs, rubs, or gallops.  ABDOMEN: Soft, nontender, nondistended. Bowel sounds present. No organomegaly or mass.  EXTREMITIES: No cyanosis, clubbing or edema b/l.    NEUROLOGIC: Cranial nerves II through XII are intact. No focal Motor or sensory deficits b/l. Globally weak  PSYCHIATRIC: The patient is alert and oriented x 1. SKIN: No obvious rash, lesion, or ulcer.    LABORATORY PANEL:   CBC Recent Labs  Lab 07/02/17 0701  WBC 10.2  HGB 11.9*  HCT 35.5  PLT 196   ------------------------------------------------------------------------------------------------------------------  Chemistries  Recent Labs  Lab 07/01/17 1511 07/02/17 0701  NA 131* 131*  K 3.0* 3.1*  CL 82* 93*  CO2 31 29  GLUCOSE 99 92  BUN 32* 23*  CREATININE 1.29* 0.96  CALCIUM 8.7* 7.6*  MG  --  1.9  AST 29  --   ALT 13*  --   ALKPHOS 170*  --   BILITOT 1.3*  --    ------------------------------------------------------------------------------------------------------------------  Cardiac Enzymes Recent Labs  Lab 07/01/17 1511  TROPONINI <0.03   ------------------------------------------------------------------------------------------------------------------  RADIOLOGY:  Ct Abdomen Pelvis Wo Contrast  Result Date: 07/01/2017 CLINICAL DATA:  Dehydration and difficulty urinating EXAM: CT ABDOMEN AND PELVIS WITHOUT CONTRAST TECHNIQUE: Multidetector CT imaging of the abdomen and pelvis was performed following the standard protocol without IV contrast. COMPARISON:  None. FINDINGS: Lower chest: No acute abnormality. Hepatobiliary: Fatty infiltration of the liver. Status post cholecystectomy. No biliary dilatation. Pancreas: Fatty infiltration of the pancreas is seen. No inflammatory changes are noted. Spleen: Normal in size without focal abnormality. Adrenals/Urinary Tract: Adrenal glands are within normal limits.  Kidneys are well visualized bilaterally. Tiny nonobstructing right upper pole  renal stone is noted. The bladder is within normal limits. Stomach/Bowel: Appendix is within normal limits. No obstructive or inflammatory changes of the bowel are seen. Vascular/Lymphatic: Aortic atherosclerosis. No enlarged abdominal or pelvic lymph nodes. Reproductive: Uterus and bilateral adnexa are unremarkable. Other: No abdominal wall hernia or abnormality. No abdominopelvic ascites. Musculoskeletal: Degenerative changes of the lumbar spine are noted. IMPRESSION: Tiny nonobstructing right renal stone Chronic changes in the liver and pancreas. No acute abnormality noted. Electronically Signed   By: [name] M.D.   On: 07/01/2017 16:47     ASSESSMENT AND PLAN:   72 year old female with past medical history of previous history of seizures, GERD, presented to the hospital due to weakness, lethargy, dysuria and also some nausea and vomiting. Patient was noted to have sepsis secondary to UTI.  1. Sepsis-patient met criteria admission given her abnormal urinalysis, tachycardia, leukocytosis. -Source is likely a urinary tract infection. Patient has allergies to multiple antibiotics. Continue aztreonam for now. -Follow cultures which were negative so far.  2. Urinary tract infection-source of patient's sepsis. Continue IV aztreonam, follow urine cultures. -Given patient's multiple allergies await infectious disease consult  3. Leukocytosis-secondary to urinary tract infection-improving with IV antibiotics.  4. Hypokalemia-continue supplement orally, we'll repeat level in the morning, check magnesium level.  5. History of seizures-continue Tegretol,Tegretol, Phenobarb,    All the records are reviewed and case discussed with Care Management/Social Worker. Management plans discussed with the patient, family and they are in agreement.  CODE STATUS: Full code  DVT Prophylaxis: Lovenox  TOTAL TIME TAKING CARE OF THIS PATIENT: 30 minutes.   POSSIBLE D/C IN 1-2 DAYS, DEPENDING ON CLINICAL  CONDITION.   Henreitta Leber M.D on 07/02/2017 at 12:43 PM  Between 7am to 6pm - Pager - [phone]  After 6pm go to [email or web address] - Proofreader  [facility]  Office  [phone]  CC: Primary care physician; Lynnell Jude, MD

## 2017-07-02 NOTE — [place] FL2 (Signed)
[place] MEDICAID FL2 LEVEL OF CARE SCREENING TOOL     IDENTIFICATION  Patient Name: Sherri Sweeney Birthdate: 22-Jan-1946 Sex: female Admission Date (Current Location): 07/01/2017  [place] and [place] Number:  [address]  [id number] [facility] Facility and Address:  [facility], [address] [place], [place] [address]      Provider Number: [id number]  Attending Physician Name and Address:  Houston Siren, MD  Relative Name and Phone Number:       Current Level of Care: Hospital Recommended Level of Care: Skilled Nursing Facility([facility]) Prior Approval Number:    Date Approved/Denied:   PASRR Number: [id number])  Discharge Plan: SNF([facility])    Current Diagnoses: Patient Active Problem List   Diagnosis Date Noted  . Sepsis (HCC) 07/01/2017    Orientation RESPIRATION BLADDER Height & Weight     Self, Place, Time  Normal Incontinent Weight: 216 lb 9.6 oz (98.2 kg) Height:  5\' 4"  (162.6 cm)  BEHAVIORAL SYMPTOMS/MOOD NEUROLOGICAL BOWEL NUTRITION STATUS      Continent Diet(Heart Healthy)  AMBULATORY STATUS COMMUNICATION OF NEEDS Skin   Extensive Assist Verbally Normal                       Personal Care Assistance Level of Assistance  Bathing, Feeding, Dressing Bathing Assistance: Limited assistance Feeding assistance: Independent Dressing Assistance: Limited assistance     Functional Limitations Info  Sight, Hearing, Speech Sight Info: Adequate Hearing Info: Adequate Speech Info: Adequate    SPECIAL CARE FACTORS FREQUENCY  PT (By licensed PT), OT (By licensed OT)     PT Frequency: (5) OT Frequency: (5)            Contractures      Additional Factors Info  Code Status, Allergies Code Status Info: (Full Code) Allergies Info: (AMPICILLIN, CEFTIN CEFUROXIME AXETIL, CONTRAST MEDIA IODINATED DIAGNOSTIC AGENTS, KEFLEX CEPHALEXIN, LEVAQUIN LEVOFLOXACIN IN D5W, NITROFURANTOIN, SEPTRA SULFAMETHOXAZOLE-TRIMETHOPRIM, VANCOMYCIN,  PENICILLINS )           Current Medications (07/02/2017):  This is the current hospital active medication list Current Facility-Administered Medications  Medication Dose Route Frequency Provider Last Rate Last Dose  . 0.9 % NaCl with KCl 20 mEq/ L  infusion   Intravenous Continuous Houston Siren, MD 100 mL/hr at 07/02/17 1012    . acetaminophen (TYLENOL) tablet 650 mg  650 mg Oral Q6H PRN Shaune Pollack, MD       Or  . acetaminophen (TYLENOL) suppository 650 mg  650 mg Rectal Q6H PRN Shaune Pollack, MD      . albuterol (PROVENTIL) (2.5 MG/3ML) 0.083% nebulizer solution 2.5 mg  2.5 mg Nebulization Q2H PRN Shaune Pollack, MD      . aztreonam (AZACTAM) 1 g in sodium chloride 0.9 % 100 mL IVPB  1 g Intravenous Q8H Cindi Carbon, [facility]   Stopped at 07/02/17 7846  . bisacodyl (DULCOLAX) EC tablet 5 mg  5 mg Oral Daily PRN Shaune Pollack, MD      . carbamazepine (TEGRETOL) tablet 200 mg  200 mg Oral BID WC Shaune Pollack, MD   200 mg at 07/02/17 9629   And  . carbamazepine (TEGRETOL) tablet 400 mg  400 mg Oral QHS Shaune Pollack, MD   400 mg at 07/02/17 0059  . enoxaparin (LOVENOX) injection 40 mg  40 mg Subcutaneous Q24H Shaune Pollack, MD   40 mg at 07/02/17 0058  . famotidine (PEPCID) tablet 20 mg  20 mg Oral BID Shaune Pollack, MD  20 mg at 07/02/17 0832  . folic acid (FOLVITE) tablet 1 mg  1 mg Oral Daily Shaune Pollackhen, Qing, MD   1 mg at 07/02/17 [id number]  . guaiFENesin (ROBITUSSIN) 100 MG/5ML solution 100 mg  100 mg Oral Q4H PRN Shaune Pollackhen, Qing, MD      . HYDROcodone-acetaminophen (NORCO/VICODIN) 5-325 MG per tablet 1-2 tablet  1-2 tablet Oral Q4H PRN Shaune Pollackhen, Qing, MD      . loperamide (IMODIUM) capsule 2 mg  2 mg Oral PRN Shaune Pollackhen, Qing, MD      . loratadine (CLARITIN) tablet 10 mg  10 mg Oral Daily PRN Shaune Pollackhen, Qing, MD      . meclizine (ANTIVERT) tablet 25 mg  25 mg Oral TID PRN Shaune Pollackhen, Qing, MD      . ondansetron [facility](ZOFRAN) injection 4 mg  4 mg Intravenous STAT Loleta RoseForbach, Cory, MD      . ondansetron [facility](ZOFRAN) tablet 4 mg  4 mg Oral Q6H PRN Shaune Pollackhen,  Qing, MD       Or  . ondansetron [facility](ZOFRAN) injection 4 mg  4 mg Intravenous Q6H PRN Shaune Pollackhen, Qing, MD   4 mg at 07/02/17 0342  . PHENObarbital 20 MG/5ML elixir 60 mg  60 mg Oral BID Oralia ManisWillis, David, MD   60 mg at 07/02/17 0933  . potassium chloride SA (K-DUR,KLOR-CON) CR tablet 20 mEq  20 mEq Oral Daily Shaune Pollackhen, Qing, MD   20 mEq at 07/02/17 [phone]  . promethazine (PHENERGAN) tablet 25 mg  25 mg Oral Q6H PRN Shaune Pollackhen, Qing, MD      . senna-docusate (Senokot-S) tablet 1 tablet  1 tablet Oral QHS PRN Shaune Pollackhen, Qing, MD      . vitamin B-12 (CYANOCOBALAMIN) tablet 500 mcg  500 mcg Oral Daily Shaune Pollackhen, Qing, MD   500 mcg at 07/02/17 [id number]     Discharge Medications: Please see discharge summary for a list of discharge medications.  Relevant Imaging Results:  Relevant Lab Results:   Additional Information (SSN: [id number])  Payton SparkAnanda A Jotham Ahn, Student-Social Work

## 2017-07-03 ENCOUNTER — Other Ambulatory Visit: Payer: Self-pay

## 2017-07-03 LAB — BASIC METABOLIC PANEL
Anion gap: 8 (ref 5–15)
BUN: 11 mg/dL (ref 6–20)
CALCIUM: 7.5 mg/dL — AB (ref 8.9–10.3)
CO2: 25 mmol/L (ref 22–32)
CREATININE: 0.67 mg/dL (ref 0.44–1.00)
Chloride: 100 mmol/L — ABNORMAL LOW (ref 101–111)
GFR calc non Af Amer: >60 mL/min (ref >60)
GLUCOSE: 83 mg/dL (ref 65–99)
Potassium: 3.4 mmol/L — ABNORMAL LOW (ref 3.5–5.1)
Sodium: 133 mmol/L — ABNORMAL LOW (ref 135–145)

## 2017-07-03 MED ORDER — SODIUM CHLORIDE 0.9 % IV SOLN
1.0000 g | INTRAVENOUS | Status: DC
  Administered 2017-07-03 – 2017-07-04 (×2): 1 g via INTRAVENOUS
  Filled 2017-07-03 (×2): qty 10

## 2017-07-03 MED ORDER — POTASSIUM CHLORIDE CRYS ER 20 MEQ PO TBCR
40.0000 meq | EXTENDED_RELEASE_TABLET | Freq: Once | ORAL | Status: AC
  Administered 2017-07-03: 40 meq via ORAL
  Filled 2017-07-03: qty 2

## 2017-07-03 NOTE — Progress Notes (Signed)
Per MD patient may be stable for D/[facility] tomorrow. Rick admissions coordinator [facility] is aware of above.   Baker Hughes IncorporatedBailey Loan Oguin, LCSW [phone]

## 2017-07-03 NOTE — Progress Notes (Addendum)
[place] at [place] NAME: Sherri Sweeney    MR#:  [id number]  DATE OF BIRTH:  01/23/1946  SUBJECTIVE:   The patient looks confused, no complaints.  REVIEW OF SYSTEMS:    Review of Systems  Constitutional: Negative for chills and fever.  HENT: Negative for congestion and tinnitus.   Eyes: Negative for blurred vision and double vision.  Respiratory: Negative for cough, shortness of breath and wheezing.   Cardiovascular: Negative for chest pain, orthopnea and PND.  Gastrointestinal: Negative for abdominal pain, diarrhea, nausea and vomiting.  Genitourinary: Negative for dysuria and hematuria.  Neurological: Negative for dizziness, sensory change and focal weakness.  All other systems reviewed and are negative.   Nutrition: Heart healthy Tolerating Diet: Yes  DRUG ALLERGIES:   Allergies  Allergen Reactions  . Ampicillin   . Ceftin [Cefuroxime Axetil]   . Contrast Media [Iodinated Diagnostic Agents] Itching  . Keflex [Cephalexin]   . Levaquin [Levofloxacin In D5w]   . Nitrofurantoin   . Septra [Sulfamethoxazole-Trimethoprim]   . Vancomycin   . Penicillins Itching and Rash    VITALS:  Blood pressure (!) 118/54, pulse 87, temperature 97.6 F (36.4 C), temperature source Oral, resp. rate 20, height _0  (1.626 m), weight 216 lb 9.6 oz (98.2 kg), SpO2 97 %.  PHYSICAL EXAMINATION:   Physical Exam  GENERAL:  72 y.o.-year-old patient lying in bed in no acute distress.  EYES: Pupils equal, round, reactive to light and accommodation. No scleral icterus. Extraocular muscles intact.  HEENT: Head atraumatic, normocephalic. Oropharynx and nasopharynx clear.  NECK:  Supple, no jugular venous distention. No thyroid enlargement, no tenderness.  LUNGS: Normal breath sounds bilaterally, no wheezing, rales, rhonchi. No use of accessory muscles of respiration.  CARDIOVASCULAR: S1, S2 normal. No murmurs, rubs, or gallops.  ABDOMEN: Soft, nontender,  nondistended. Bowel sounds present. No organomegaly or mass.  EXTREMITIES: No cyanosis, clubbing or edema b/l.    NEUROLOGIC: Cranial nerves II through XII are intact. No focal Motor or sensory deficits b/l. Globally weak  PSYCHIATRIC: The patient is alert and oriented x 1. SKIN: No obvious rash, lesion, or ulcer.    LABORATORY PANEL:   CBC Recent Labs  Lab 07/02/17 0701  WBC 10.2  HGB 11.9*  HCT 35.5  PLT 196   ------------------------------------------------------------------------------------------------------------------  Chemistries  Recent Labs  Lab 07/01/17 1511 07/02/17 0701 07/03/17 0430  NA 131* 131* 133*  K 3.0* 3.1* 3.4*  CL 82* 93* 100*  CO2 _1 GLUCOSE 99 92 83  BUN 32* 23* 11  CREATININE 1.29* 0.96 0.67  CALCIUM 8.7* 7.6* 7.5*  MG  --  1.9  --   AST 29  --   --   ALT 13*  --   --   ALKPHOS 170*  --   --   BILITOT 1.3*  --   --    ------------------------------------------------------------------------------------------------------------------  Cardiac Enzymes Recent Labs  Lab 07/01/17 1511  TROPONINI <0.03   ------------------------------------------------------------------------------------------------------------------  RADIOLOGY:  Ct Abdomen Pelvis Wo Contrast  Result Date: 07/01/2017 CLINICAL DATA:  Dehydration and difficulty urinating EXAM: CT ABDOMEN AND PELVIS WITHOUT CONTRAST TECHNIQUE: Multidetector CT imaging of the abdomen and pelvis was performed following the standard protocol without IV contrast. COMPARISON:  None. FINDINGS: Lower chest: No acute abnormality. Hepatobiliary: Fatty infiltration of the liver. Status post cholecystectomy. No biliary dilatation. Pancreas: Fatty infiltration of the pancreas is seen. No inflammatory changes are noted. Spleen: Normal in size without focal  abnormality. Adrenals/Urinary Tract: Adrenal glands are within normal limits. Kidneys are well visualized bilaterally. Tiny nonobstructing right upper  pole renal stone is noted. The bladder is within normal limits. Stomach/Bowel: Appendix is within normal limits. No obstructive or inflammatory changes of the bowel are seen. Vascular/Lymphatic: Aortic atherosclerosis. No enlarged abdominal or pelvic lymph nodes. Reproductive: Uterus and bilateral adnexa are unremarkable. Other: No abdominal wall hernia or abnormality. No abdominopelvic ascites. Musculoskeletal: Degenerative changes of the lumbar spine are noted. IMPRESSION: Tiny nonobstructing right renal stone Chronic changes in the liver and pancreas. No acute abnormality noted. Electronically Signed   By: [name] M.D.   On: 07/01/2017 16:47     ASSESSMENT AND PLAN:   72 year old female with past medical history of previous history of seizures, GERD, presented to the hospital due to weakness, lethargy, dysuria and also some nausea and vomiting. Patient was noted to have sepsis secondary to UTI.  1. Sepsis-patient met criteria admission given her abnormal urinalysis, tachycardia, leukocytosis. -Source is likely a urinary tract infection. Patient has allergies to multiple antibiotics. Continue aztreonam for now. U/C: >=100,000 COLONIES/mL PROTEUS MIRABILIS.  Follow-up sensitivity.  2. Urinary tract infection-source of patient's sepsis. Continue IV aztreonam, U/C: >=100,000 COLONIES/mL PROTEUS MIRABILIS.  Follow-up sensitivity. -Given patient's multiple allergies await infectious disease consult  3. Leukocytosis-secondary to urinary tract infection-improving with IV antibiotics.  Improved.  4. Hypokalemia- improving with potassium supplement.  5. History of seizures-continue Tegretol,Tegretol, Phenobarb,   Hyponatremia.  Continue normal saline IV and follow-up BMP.  PT evaluation suggest skilled nursing facility. All the records are reviewed and case discussed with Care Management/Social Worker. Management plans discussed with the patient, family and they are in agreement.  CODE  STATUS: Full code  DVT Prophylaxis: Lovenox  TOTAL TIME TAKING CARE OF THIS PATIENT: 30 minutes.   POSSIBLE D/C IN 1-2 DAYS, DEPENDING ON CLINICAL CONDITION.   Demetrios Loll M.D on 07/03/2017 at 1:41 PM  Between 7am to 6pm - Pager - [phone]  After 6pm go to [email or web address] - Proofreader  [facility] [place] [facility]  Office  [phone]  CC: Primary care physician; Lynnell Jude, MD

## 2017-07-03 NOTE — Consult Note (Signed)
[facility] Infectious Disease     Reason for Consult: UTI, drug allergies   Referring Physician: Estanislado Spire Date of Admission:  07/01/2017   Active Problems:   Sepsis ([place])   HPI: Sherri Sweeney is a 72 y.o. female admitted from NH with dysuria, weakness and lethargy. On admit has wbc 19, UA with TNTC WBC, ARF. Started on aztreonam with WBC down to 10 and no fevers. Marland Kitchen UCX with Proteus.  She is listed as being allergic to penicillins, cephalosporins, levofloxacin, macrobid and septra.   She has hx of seizures, CVA. Lives at a facility. Is increasingly bedbound.  Most of this history is obtained from family at bedside but pt able to participate some.   Past Medical History:  Diagnosis Date  . Falling   . 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 History of convulsions   . Unspecified convulsions ([place])   . Unsteadiness on feet    Past Surgical History:  Procedure Laterality Date  . APPENDECTOMY    . CHOLECYSTECTOMY     Social History   Tobacco Use  . Smoking status: Never Smoker  . Smokeless tobacco: Never Used  Substance Use Topics  . Alcohol use: No    Frequency: Never  . Drug use: No   No family history on file.  Allergies:  Allergies  Allergen Reactions  . Ampicillin   . Ceftin [Cefuroxime Axetil]   . Contrast Media [Iodinated Diagnostic Agents] Itching  . Keflex [Cephalexin]   . Levaquin [Levofloxacin In D5w]   . Nitrofurantoin   . Septra [Sulfamethoxazole-Trimethoprim]   . Vancomycin   . Penicillins Itching and Rash    Current antibiotics: Antibiotics Given (last 72 hours)    Date/Time Action Medication Dose Rate   07/01/17 1951 New Bag/Given   aztreonam (AZACTAM) 2 g in sodium chloride 0.9 % 100 mL IVPB 2 g 200 mL/hr   07/02/17 7106 New Bag/Given   aztreonam (AZACTAM) 1 g in sodium chloride 0.9 % 100 mL IVPB 1 g 200 mL/hr   07/02/17 1316 New Bag/Given   aztreonam (AZACTAM) 1 g in sodium chloride 0.9 % 100 mL IVPB 1 g 200  mL/hr   07/02/17 2153 New Bag/Given   aztreonam (AZACTAM) 1 g in sodium chloride 0.9 % 100 mL IVPB 1 g 200 mL/hr   07/03/17 0450 New Bag/Given   aztreonam (AZACTAM) 1 g in sodium chloride 0.9 % 100 mL IVPB 1 g 200 mL/hr      MEDICATIONS: . carbamazepine  200 mg Oral BID WC   And  . carbamazepine  400 mg Oral QHS  . enoxaparin (LOVENOX) injection  40 mg Subcutaneous Q24H  . famotidine  20 mg Oral BID  . folic acid  1 mg Oral Daily  . PHENObarbital  60 mg Oral BID  . potassium chloride SA  20 mEq Oral Daily  . vitamin B-12  500 mcg Oral Daily    Review of Systems - 11 systems reviewed and negative per HPI   OBJECTIVE: Temp:  [97.5 F (36.4 C)-98.8 F (37.1 C)] 97.6 F (36.4 C) (02/13 0753) Pulse Rate:  [80-94] 87 (02/13 0753) Resp:  [16-20] 20 (02/13 0420) BP: (98-127)/(51-78) 118/54 (02/13 0753) SpO2:  [94 %-100 %] 97 % (02/13 0753) Physical Exam  Constitutional:  Obese, lying in bed, awake and able to converse some HENT: [place]/AT, PERRLA, no scleral icterus Mouth/Throat: Oropharynx is clear and dry. No oropharyngeal exudate.  Cardiovascular: Normal rate, regular rhythm  and normal heart sounds. Pulmonary/Chest: Effort normal and breath sounds normal. No respiratory distress.  has no wheezes.  Neck = supple, no nuchal rigidity Abdominal: Soft. Bowel sounds are normal.  exhibits no distension. There is no tenderness.  Lymphadenopathy: no cervical adenopathy. No axillary adenopathy Neurological, awake and able to converse some Skin: some bruising and abrasions Psychiatric: a normal mood and affect.  behavior is normal.    LABS: Results for orders placed or performed during the hospital encounter of 07/01/17 (from the past 48 hour(s))  CBC with Differential/Platelet     Status: Abnormal   Collection Time: 07/01/17  3:11 PM  Result Value Ref Range   WBC 19.6 (H) 3.6 - 11.0 K/uL   RBC 4.52 3.80 - 5.20 MIL/uL   Hemoglobin 14.4 12.0 - 16.0 g/dL   HCT 42.7 35.0 - 47.0 %   MCV  94.6 80.0 - 100.0 fL   MCH 31.8 26.0 - 34.0 pg   MCHC 33.6 32.0 - 36.0 g/dL   RDW 13.4 11.5 - 14.5 %   Platelets 281 150 - 440 K/uL   Neutrophils Relative % 82 %   Neutro Abs 16.2 (H) 1.4 - 6.5 K/uL   Lymphocytes Relative 8 %   Lymphs Abs 1.5 1.0 - 3.6 K/uL   Monocytes Relative 9 %   Monocytes Absolute 1.7 (H) 0.2 - 0.9 K/uL   Eosinophils Relative 1 %   Eosinophils Absolute 0.1 0 - 0.7 K/uL   Basophils Relative 0 %   Basophils Absolute 0.1 0 - 0.1 K/uL    Comment: Performed at [facility], [address] [place], [place] [address]  Lipase, blood     Status: Abnormal   Collection Time: 07/01/17  3:11 PM  Result Value Ref Range   Lipase 65 (H) 11 - 51 U/L    Comment: Performed at [facility], [address] [place], [place] [address]  Comprehensive metabolic panel     Status: Abnormal   Collection Time: 07/01/17  3:11 PM  Result Value Ref Range   Sodium 131 (L) 135 - 145 mmol/L   Potassium 3.0 (L) 3.5 - 5.1 mmol/L   Chloride 82 (L) 101 - 111 mmol/L   CO2 31 22 - 32 mmol/L   Glucose, Bld 99 65 - 99 mg/dL   BUN 32 (H) 6 - 20 mg/dL   Creatinine, Ser 1.29 (H) 0.44 - 1.00 mg/dL   Calcium 8.7 (L) 8.9 - 10.3 mg/dL   Total Protein 8.1 6.5 - 8.1 g/dL   Albumin 3.0 (L) 3.5 - 5.0 g/dL   AST 29 15 - 41 U/L   ALT 13 (L) 14 - 54 U/L   Alkaline Phosphatase 170 (H) 38 - 126 U/L   Total Bilirubin 1.3 (H) 0.3 - 1.2 mg/dL   GFR calc non Af Amer 41 (L) >60 mL/min   GFR calc Af Amer 48 (L) >60 mL/min    Comment: (NOTE) The eGFR has been calculated using the CKD EPI equation. This calculation has not been validated in all clinical situations. eGFR's persistently <60 mL/min signify possible Chronic Kidney Disease.    Anion gap 18 (H) 5 - 15    Comment: Performed at [facility], [address] [place], [place] [address]  Troponin I     Status: None   Collection Time: 07/01/17  3:11 PM  Result Value Ref Range   Troponin I <0.03 <0.03 ng/mL    Comment:  Performed at [facility], [address] [place], [place] [address]  Lactic acid, plasma     Status: None   Collection Time: 07/01/17  4:11 PM  Result Value Ref Range   Lactic Acid, Venous 1.8 0.5 - 1.9 mmol/L    Comment: Performed at [facility], [address] [place], [place] [address]  Urinalysis, Routine w reflex microscopic     Status: Abnormal   Collection Time: 07/01/17  5:56 PM  Result Value Ref Range   Color, Urine YELLOW (A) YELLOW   APPearance TURBID (A) CLEAR   Specific Gravity, Urine 1.006 1.005 - 1.030   pH 8.0 5.0 - 8.0   Glucose, UA NEGATIVE NEGATIVE mg/dL   Hgb urine dipstick SMALL (A) NEGATIVE   Bilirubin Urine NEGATIVE NEGATIVE   Ketones, ur NEGATIVE NEGATIVE mg/dL   Protein, ur NEGATIVE NEGATIVE mg/dL   Nitrite POSITIVE (A) NEGATIVE   Leukocytes, UA LARGE (A) NEGATIVE   RBC / HPF TOO NUMEROUS TO COUNT 0 - 5 RBC/hpf   WBC, UA TOO NUMEROUS TO COUNT 0 - 5 WBC/hpf   Bacteria, UA MANY (A) NONE SEEN   Squamous Epithelial / LPF 6-30 (A) NONE SEEN   WBC Clumps PRESENT    Mucus PRESENT    Hyaline Casts, UA PRESENT     Comment: Performed at [facility], [address], [place], [place] [address]  Urine Culture     Status: Abnormal (Preliminary result)   Collection Time: 07/01/17  5:56 PM  Result Value Ref Range   Specimen Description      URINE, RANDOM Performed at [facility], [address], [place], [place] [address]    Special Requests      Normal Performed at [facility], [place], [place] [address]    Culture (A)     >=100,000 COLONIES/mL PROTEUS MIRABILIS SUSCEPTIBILITIES TO FOLLOW Performed at [facility], [place] [address], [place], [place] [address]    Report Status PENDING   Lactic acid, plasma     Status: None   Collection Time: 07/01/17  7:26 PM  Result Value Ref Range   Lactic Acid, Venous 1.4 0.5 - 1.9 mmol/L    Comment: Performed at [facility], [address] [place], [place] [address]  MRSA PCR Screening     Status: None   Collection Time: 07/02/17  2:20 AM  Result Value Ref Range   MRSA by PCR NEGATIVE NEGATIVE    Comment:        The GeneXpert MRSA Assay (FDA approved for NASAL specimens only), is one component of a comprehensive MRSA colonization surveillance program. It is not intended to diagnose MRSA infection nor to guide or monitor treatment for MRSA infections. Performed at [facility], [address] [place], [place] [address]   Basic metabolic panel     Status: Abnormal   Collection Time: 07/02/17  7:01 AM  Result Value Ref Range   Sodium 131 (L) 135 - 145 mmol/L   Potassium 3.1 (L) 3.5 - 5.1 mmol/L   Chloride 93 (L) 101 - 111 mmol/L   CO2 29 22 - 32 mmol/L   Glucose, Bld 92 65 - 99 mg/dL   BUN 23 (H) 6 - 20 mg/dL   Creatinine, Ser 0.96 0.44 - 1.00 mg/dL   Calcium 7.6 (L) 8.9 - 10.3 mg/dL   GFR calc non Af Amer 59 (L) >60 mL/min   GFR calc Af Amer >60 >60 mL/min    Comment: (NOTE) The  eGFR has been calculated using the CKD EPI equation. This calculation has not been validated in all clinical situations. eGFR's persistently <60 mL/min signify possible Chronic Kidney Disease.    Anion gap 9 5 - 15    Comment: Performed at [facility], [address] [place], [place] [address]  CBC     Status: Abnormal   Collection Time: 07/02/17  7:01 AM  Result Value Ref Range   WBC 10.2 3.6 - 11.0 K/uL   RBC 3.75 (L) 3.80 - 5.20 MIL/uL   Hemoglobin 11.9 (L) 12.0 - 16.0 g/dL   HCT 35.5 35.0 - 47.0 %   MCV 94.6 80.0 - 100.0 fL   MCH 31.8 26.0 - 34.0 pg   MCHC 33.6 32.0 - 36.0 g/dL   RDW 13.4 11.5 - 14.5 %   Platelets 196 150 - 440 K/uL    Comment: Performed at [facility], [address] [place], [place] [address]  Procalcitonin     Status: None   Collection Time: 07/02/17  7:01 AM  Result Value Ref Range   Procalcitonin <0.10 ng/mL    Comment:        Interpretation: PCT  (Procalcitonin) <= 0.5 ng/mL: Systemic infection (sepsis) is not likely. Local bacterial infection is possible. (NOTE)       Sepsis PCT Algorithm           Lower Respiratory Tract                                      Infection PCT Algorithm    ----------------------------     ----------------------------         PCT < 0.25 ng/mL                PCT < 0.10 ng/mL         Strongly encourage             Strongly discourage   discontinuation of antibiotics    initiation of antibiotics    ----------------------------     -----------------------------       PCT 0.25 - 0.50 ng/mL            PCT 0.10 - 0.25 ng/mL               OR       >80% decrease in PCT            Discourage initiation of                                            antibiotics      Encourage discontinuation           of antibiotics    ----------------------------     -----------------------------         PCT >= 0.50 ng/mL              PCT 0.26 - 0.50 ng/mL               AND        <80% decrease in PCT             Encourage initiation of  antibiotics       Encourage continuation           of antibiotics    ----------------------------     -----------------------------        PCT >= 0.50 ng/mL                  PCT > 0.50 ng/mL               AND         increase in PCT                  Strongly encourage                                      initiation of antibiotics    Strongly encourage escalation           of antibiotics                                     -----------------------------                                           PCT <= 0.25 ng/mL                                                 OR                                        > 80% decrease in PCT                                     Discontinue / Do not initiate                                             antibiotics Performed at [facility], [address] [place], [place] [address]   Protime-INR      Status: Abnormal   Collection Time: 07/02/17  7:01 AM  Result Value Ref Range   Prothrombin Time 16.5 (H) 11.4 - 15.2 seconds   INR 1.34     Comment: Performed at [facility], [address] [place], [place] [address]  APTT     Status: Abnormal   Collection Time: 07/02/17  7:01 AM  Result Value Ref Range   aPTT 50 (H) 24 - 36 seconds    Comment:        IF BASELINE aPTT IS ELEVATED, SUGGEST PATIENT RISK ASSESSMENT BE USED TO DETERMINE APPROPRIATE ANTICOAGULANT THERAPY. Performed at [facility], [address] [place], [place] [address]   Magnesium     Status: None   Collection Time: 07/02/17  7:01 AM  Result Value Ref Range   Magnesium 1.9 1.7 - 2.4 mg/dL    Comment: Performed at [facility], [address] [place], [place] [address]  Culture, blood (x 2)     Status: None (Preliminary result)   Collection Time: 07/02/17 11:02 AM  Result Value Ref Range   Specimen Description BLOOD RIGHT HAND    Special Requests      BOTTLES DRAWN AEROBIC AND ANAEROBIC Blood Culture adequate volume   Culture      NO GROWTH < 24 HOURS Performed at [facility], [address], [place], [place] [address]    Report Status PENDING   Culture, blood (x 2)     Status: None (Preliminary result)   Collection Time: 07/02/17 11:03 AM  Result Value Ref Range   Specimen Description BLOOD LEFT HAND    Special Requests      BOTTLES DRAWN AEROBIC AND ANAEROBIC Blood Culture results may not be optimal due to an inadequate volume of blood received in culture bottles   Culture      NO GROWTH < 24 HOURS Performed at [facility], [address] [place], [place] [address]    Report Status PENDING   Basic metabolic panel     Status: Abnormal   Collection Time: 07/03/17  4:30 AM  Result Value Ref Range   Sodium 133 (L) 135 - 145 mmol/L   Potassium 3.4 (L) 3.5 - 5.1 mmol/L   Chloride 100 (L) 101 - 111 mmol/L   CO2 25 22 - 32 mmol/L   Glucose, Bld 83 65  - 99 mg/dL   BUN 11 6 - 20 mg/dL   Creatinine, Ser 0.67 0.44 - 1.00 mg/dL   Calcium 7.5 (L) 8.9 - 10.3 mg/dL   GFR calc non Af Amer >60 >60 mL/min   GFR calc Af Amer >60 >60 mL/min    Comment: (NOTE) The eGFR has been calculated using the CKD EPI equation. This calculation has not been validated in all clinical situations. eGFR's persistently <60 mL/min signify possible Chronic Kidney Disease.    Anion gap 8 5 - 15    Comment: Performed at [facility], [address] [place], [place] [address]   No components found for: ESR, C REACTIVE PROTEIN MICRO: Recent Results (from the past 720 hour(s))  Urine Culture     Status: Abnormal (Preliminary result)   Collection Time: 07/01/17  5:56 PM  Result Value Ref Range Status   Specimen Description   Final    URINE, RANDOM Performed at [facility], [address], [place], [place] [address]    Special Requests   Final    Normal Performed at [facility], [address], [place], [place] [address]    Culture (A)  Final    >=100,000 COLONIES/mL PROTEUS MIRABILIS SUSCEPTIBILITIES TO FOLLOW Performed at [facility], [place] [address] [place], [place] [address]    Report Status PENDING  Incomplete  MRSA PCR Screening     Status: None   Collection Time: 07/02/17  2:20 AM  Result Value Ref Range Status   MRSA by PCR NEGATIVE NEGATIVE Final    Comment:        The GeneXpert MRSA Assay (FDA approved for NASAL specimens only), is one component of a comprehensive MRSA colonization surveillance program. It is not intended to diagnose MRSA infection nor to guide or monitor treatment for MRSA infections. Performed at [facility], [address] [place], [place] [address]   Culture, blood (x 2)     Status: None (Preliminary result)   Collection Time: 07/02/17  11:02 AM  Result Value Ref Range Status   Specimen Description BLOOD RIGHT HAND  Final   Special Requests   Final    BOTTLES DRAWN  AEROBIC AND ANAEROBIC Blood Culture adequate volume   Culture   Final    NO GROWTH < 24 HOURS Performed at [facility], [address], [place], [place] [address]    Report Status PENDING  Incomplete  Culture, blood (x 2)     Status: None (Preliminary result)   Collection Time: 07/02/17 11:03 AM  Result Value Ref Range Status   Specimen Description BLOOD LEFT HAND  Final   Special Requests   Final    BOTTLES DRAWN AEROBIC AND ANAEROBIC Blood Culture results may not be optimal due to an inadequate volume of blood received in culture bottles   Culture   Final    NO GROWTH < 24 HOURS Performed at [facility], [address], [place], [place] [address]    Report Status PENDING  Incomplete    IMAGING: Ct Abdomen Pelvis Wo Contrast  Result Date: 07/01/2017 CLINICAL DATA:  Dehydration and difficulty urinating EXAM: CT ABDOMEN AND PELVIS WITHOUT CONTRAST TECHNIQUE: Multidetector CT imaging of the abdomen and pelvis was performed following the standard protocol without IV contrast. COMPARISON:  None. FINDINGS: Lower chest: No acute abnormality. Hepatobiliary: Fatty infiltration of the liver. Status post cholecystectomy. No biliary dilatation. Pancreas: Fatty infiltration of the pancreas is seen. No inflammatory changes are noted. Spleen: Normal in size without focal abnormality. Adrenals/Urinary Tract: Adrenal glands are within normal limits. Kidneys are well visualized bilaterally. Tiny nonobstructing right upper pole renal stone is noted. The bladder is within normal limits. Stomach/Bowel: Appendix is within normal limits. No obstructive or inflammatory changes of the bowel are seen. Vascular/Lymphatic: Aortic atherosclerosis. No enlarged abdominal or pelvic lymph nodes. Reproductive: Uterus and bilateral adnexa are unremarkable. Other: No abdominal wall hernia or abnormality. No abdominopelvic ascites. Musculoskeletal: Degenerative changes of the lumbar spine are noted.  IMPRESSION: Tiny nonobstructing right renal stone Chronic changes in the liver and pancreas. No acute abnormality noted. Electronically Signed   By: [name] M.D.   On: 07/01/2017 16:47    Assessment:   Sherri Sweeney is a 72 y.o. female with seizures who lives at [facility], increasingly immobile per family admitted with AMS and found to have wbc 19, ARF, and Proteus UTI.  She is improving but still complaining of abd pain. She has multiple allergies but per pt and family none of the reactions were severe or involved skin lesions, or anaphylaxis. They were mainly intolerances with nausea and vomiting.  I think at this point we can try to treat her with ceftriaxone and await culture results.  Recommendations Dc aztreonam. Start ceftriaxone. Continue to follow and will adjust abx based on cx results.  Thank you very much for allowing me to participate in the care of this patient. Please call with questions.   Cheral Marker. Ola Spurr, MD

## 2017-07-03 NOTE — Evaluation (Signed)
Clinical/Bedside Swallow Evaluation Patient Details  Name: Sherri Sweeney MRN: [id number] Date of Birth: [birth year]  Today's Date: 07/03/2017 Time: SLP Start Time (ACUTE ONLY): 1235 SLP Stop Time (ACUTE ONLY): 1335 SLP Time Calculation (min) (ACUTE ONLY): 60 min  Past Medical History:  Past Medical History:  Diagnosis Date  . Falling   . 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 History of convulsions   . Unspecified convulsions (HCC)   . Unsteadiness on feet    Past Surgical History:  Past Surgical History:  Procedure Laterality Date  . APPENDECTOMY    . CHOLECYSTECTOMY     HPI:  Pt is a 72 y.o. female with medical history of convulsions, falling(now less mobile), and Reflux(on PPI 2x daily) who is chronically ill and lives at [place] who presents by EMS for evaluation of possible dehydration and possible urinary tract infection.  Reportedly she has had gradually worsening symptoms over the last week that include burning with urination, increased lethargy, increased generalized weakness, inability to get out of bed even with a lift, and altered mental status with increased confusion.  She also reports that she generalized abdominal pain and occasionally has shortness of breath.  She and her family, who provide most of the history, reports that the symptoms are severe and nothing is making them better or worse.  She says that she feels terrible all over but is unable to describe any other symptoms except as described above.  There is no report of fever, chest pain, nor shortness of breath except with exertion. Unsure of pt's baseline Cognitive status; requires verbal cueing and easily distracted.    Assessment / Plan / Recommendation Clinical Impression  Pt appears to present w/ an adequate oropharyngeal phase swallow function w/ reduced risk for immediate aspiration from an oropharyngeal phase standpoint when following general aspiration precautions to include  NO LARGE STRAWS or Straw use altogether if overt s/s of aspiration are noted during use. Pt consumed po trials of thin liquids via cup/straw and purees (refused soft solids) w/ no immediate, overt s/s of aspiration noted. During trials of thin liquids, pt exhbited adequate oral control and timly swallowign; clear vocal quality post trials. Pt required education and monitoring/cues to take smaller sips more slowly - she tended to be impulsive when drinking. Pt accepted few trials of puree but then refused further - she often eluded to N/V feelings and overal discomfort in her "back" from sitting up in bed(NSG informed). Pt masticated ice chips w/ no deficits noted during mastication(adequate bolus control; appropriate oropharyngeal clearing w/ all po trials given).  Recommend a modified diet to Dysphagia level 3(mech soft); thin liquids - no straws if coughing noted. General aspiration precautions to include sitting upright; REFLUX precautions. STRONGLY recommend PIlls in PUREE for safer, easier swallowing - NSG/family reported has issues coughing when attempting to swallow pills w/ water this morning. Pt would benefit from some Supervision during meals d/t Cognitive status for better follow through w/ general aspiration precautions. Recommend f/u w/ MD/NSG re: any back pain issues as this will hamper pt's positioning upright for oral intake. PT following pt as well.  SLP Visit Diagnosis: Dysphagia, oropharyngeal phase (R13.12)    Aspiration Risk  (reduced following aspiration precautions)    Diet Recommendation  Mech Soft diet(Dysphagia level 3) w/ Thin liquids; general aspiration precautions to include Sitting Upright for any oral intake and No straws if coughing noted; Supervision during meals for follow through w/ precautions; Reflux  precautions  Medication Administration: Whole meds with puree(for safer swallowing)    Other  Recommendations Recommended Consults: (Dietician f/u) Oral Care  Recommendations: Oral care BID;Patient independent with oral care;Staff/trained caregiver to provide oral care Other Recommendations: (n/a)   Follow up Recommendations None      Frequency and Duration min 2x/week  1 week       Prognosis Prognosis for Safe Diet Advancement: Fair(-Good) Barriers to Reach Goals: Cognitive deficits(at baseline?)      Swallow Study   General Date of Onset: 07/01/17 HPI: Pt is a 72 y.o. female with medical history of convulsions, falling(now less mobile), and Reflux(on PPI 2x daily) who is chronically ill and lives at [place] who presents by EMS for evaluation of possible dehydration and possible urinary tract infection.  Reportedly she has had gradually worsening symptoms over the last week that include burning with urination, increased lethargy, increased generalized weakness, inability to get out of bed even with a lift, and altered mental status with increased confusion.  She also reports that she generalized abdominal pain and occasionally has shortness of breath.  She and her family, who provide most of the history, reports that the symptoms are severe and nothing is making them better or worse.  She says that she feels terrible all over but is unable to describe any other symptoms except as described above.  There is no report of fever, chest pain, nor shortness of breath except with exertion. Unsure of pt's baseline Cognitive status; requires verbal cueing and easily distracted.  Type of Study: Bedside Swallow Evaluation Previous Swallow Assessment: none reported Diet Prior to this Study: Regular;Thin liquids Temperature Spikes Noted: No(wbc 10.2) Respiratory Status: Room air History of Recent Intubation: No Behavior/Cognition: Alert;Cooperative;Pleasant mood;Distractible;Requires cueing(baseline Cognition?) Oral Cavity Assessment: Dry(sticky) Oral Care Completed by SLP: Recent completion by staff Oral Cavity - Dentition: Adequate natural  dentition Vision: Functional for self-feeding Self-Feeding Abilities: Able to feed self;Needs assist;Needs set up Patient Positioning: Upright in bed(but became incomfortable positioning(back); NSG told) Baseline Vocal Quality: Normal Volitional Cough: Strong Volitional Swallow: Able to elicit    Oral/Motor/Sensory Function Overall Oral Motor/Sensory Function: Within functional limits(required cues for follow through)   Ice Chips Ice chips: Within functional limits Presentation: Spoon(fed; 4 trials) Other Comments: "likes" ice   Thin Liquid Thin Liquid: Within functional limits Presentation: Cup;Self Fed;Straw(3 trials each; multiple(2-3) sips via straw x2) Other Comments: educated pt on need to slow down, take small sips to lessen risk for aspiration    Nectar Thick Nectar Thick Liquid: Not tested   Honey Thick Honey Thick Liquid: Not tested   Puree Puree: Within functional limits Presentation: Self Fed;Spoon(3 trials) Other Comments: pt refused further   Solid   GO   Solid: Not tested Other Comments: pt refused        Jerilynn SomKatherine [name], MS, CCC-SLP [name],[name] 07/03/2017,3:42 PM

## 2017-07-03 NOTE — Evaluation (Signed)
Physical Therapy Evaluation Patient Details Name: Sherri Sweeney MRN: [id number] DOB: 03/06/1946 Today's Date: 07/03/2017   History of Present Illness  Pt is a 72 y/o F who presented from nursing home due to dysuria, lethargy, generalized weakness.  Pt was found tachycardia, leukocytosis, and UTI in the ED.  Pt admitted for sepsis due to UTI.  Pt's PMH includes R sided weakness, convulsions, unsteadiness on feet.     Clinical Impression  Pt admitted with above diagnosis. Pt currently with functional limitations due to the deficits listed below (see PT Problem List). Sherri Sweeney presents with impaired cognition and significant fear of falling, limiting PT session.  She becomes very anxious and begins crying due to fear of falling and upper thoracic pain after performing sit<>stand x2.  Pt currently requires up to max +2 assist for bed mobility and min +2 assist for sit<>stand.  Unable to attempt stand pivot transfer or ambulation due to pt's anxiety.  Given pt's current mobility status, recommending SNF at d/c.   Pt will benefit from skilled PT to increase their independence and safety with mobility to allow discharge to the venue listed below.      Follow Up Recommendations SNF    Equipment Recommendations  Other (comment)(TBD at next venue of care)    Recommendations for Other Services       Precautions / Restrictions Precautions Precautions: Fall;Other (comment) Precaution Comments: pt with significant fear of falling Restrictions Weight Bearing Restrictions: No      Mobility  Bed Mobility Overal bed mobility: Needs Assistance Bed Mobility: Supine to Sit;Sit to Supine     Supine to sit: Mod assist;+2 for physical assistance;HOB elevated Sit to supine: Max assist;+2 for physical assistance   General bed mobility comments: Pt initiates supine>sit but requires mod assist +2 to elevate trunk and to assist scooting to EOB with use of bed pad. To return to supine pt requies max +2 assist  as pt is upset about her pain and is difficult to redirect.   Transfers Overall transfer level: Needs assistance Equipment used: Rolling walker (2 wheeled) Transfers: Sit to/from Stand Sit to Stand: Min assist;+2 physical assistance         General transfer comment: Cues for proper hand placement and assist to boost to standing.  Performed x2 but pt not achieving full upright.  Despite extensive assurance provided the pt becomes extremely anxious about falling with sit>stand and begins to cry due to fear.    Ambulation/Gait             General Gait Details: Not safe to attempt at this time  Stairs            Wheelchair Mobility    Modified Rankin (Stroke Patients Only)       Balance Overall balance assessment: Needs assistance Sitting-balance support: Feet supported;No upper extremity supported Sitting balance-Leahy Scale: Fair Sitting balance - Comments: See general comments below   Standing balance support: Bilateral upper extremity supported;During functional activity Standing balance-Leahy Scale: Poor Standing balance comment: Relies on UE support and outside physical assist due to significant fear of falling                             Pertinent Vitals/Pain Pain Assessment: Faces Faces Pain Scale: Hurts worst Pain Location: upper thoracic region Pain Descriptors / Indicators: Aching;Discomfort;Grimacing;Crying(grossly TTP in upper thoracic region Bil) Pain Intervention(s): Limited activity within patient's tolerance;Monitored during session;Repositioned;Utilized relaxation techniques  Home Living Family/patient expects to be discharged to:: Skilled nursing facility                 Additional Comments: Pt from SNF setting at [facility]    Prior Function Level of Independence: Needs assistance   Gait / Transfers Assistance Needed: Pt has not been ambulating for the past few months as the niece reports that the staff does not allow  her to as she was having too many falls.  Pt is limited to performing stand pivot transfers to her WC with assist.  Pt has had several falls in the past 6 months.   ADL's / Homemaking Assistance Needed: Pt likely required assist with most ADLs  Comments: Pt is a poor historian so information gathered from her niece who was present during second half of evaluation session     Hand Dominance        Extremity/Trunk Assessment   Upper Extremity Assessment Upper Extremity Assessment: RUE deficits/detail;LUE deficits/detail RUE Deficits / Details: RUE strength grossly 2/5 LUE Deficits / Details: LUE strength grossly 3/5    Lower Extremity Assessment Lower Extremity Assessment: RLE deficits/detail;LLE deficits/detail RLE Deficits / Details: RLE strength grossly 3/5 LLE Deficits / Details: LLE strength grossly 4-/5       Communication   Communication: No difficulties  Cognition Arousal/Alertness: Awake/alert Behavior During Therapy: Anxious;Restless Overall Cognitive Status: Impaired/Different from baseline Area of Impairment: Orientation;Attention;Memory;Following commands;Safety/judgement;Awareness;Problem solving                 Orientation Level: Disoriented to;Time;Situation;Place Current Attention Level: Selective Memory: Decreased short-term memory Following Commands: Follows one step commands inconsistently Safety/Judgement: Decreased awareness of safety;Decreased awareness of deficits Awareness: Emergent Problem Solving: Slow processing;Requires verbal cues;Requires tactile cues General Comments: Niece reports pt is not at her baseline level of cognition      General Comments General comments (skin integrity, edema, etc.): After second sit>stand the pt sitting on the EOB and crying with spontaneous LOB anteriorly requiring min assist.  Pt reports she is crying and upset due to fear of falling and upper thoracic pain.     Exercises General Exercises - Upper  Extremity Elbow Flexion: AROM;Right;10 reps;Supine General Exercises - Lower Extremity Ankle Circles/Pumps: PROM;Both;10 reps;Supine;Other (comment)(pt doesn't understand cues to perform without assist) Quad Sets: Strengthening;Both;10 reps;Supine Heel Slides: Strengthening;Both;AAROM;10 reps;Supine Hip ABduction/ADduction: AAROM;Strengthening;Both;5 reps;Supine;Other (comment)(d/c after 5 reps as pt reports pain in groin Bil) Straight Leg Raises: Strengthening;AAROM;Both;10 reps;Supine   Assessment/Plan    PT Assessment Patient needs continued PT services  PT Problem List Decreased strength;Decreased activity tolerance;Decreased range of motion;Decreased balance;Decreased mobility;Decreased cognition;Decreased knowledge of use of DME;Decreased safety awareness;Pain       PT Treatment Interventions DME instruction;Gait training;Functional mobility training;Therapeutic activities;Balance training;Therapeutic exercise;Neuromuscular re-education;Cognitive remediation;Patient/family education;Wheelchair mobility training;Modalities    PT Goals (Current goals can be found in the Care Plan section)  Acute Rehab PT Goals Patient Stated Goal: decreased pain PT Goal Formulation: With patient Time For Goal Achievement: 07/17/17 Potential to Achieve Goals: Good    Frequency Min 2X/week   Barriers to discharge        Co-evaluation               AM-PAC PT "6 Clicks" Daily Activity  Outcome Measure Difficulty turning over in bed (including adjusting bedclothes, sheets and blankets)?: Unable Difficulty moving from lying on back to sitting on the side of the bed? : Unable Difficulty sitting down on and standing up from a chair with arms (e.g., wheelchair, bedside  commode, etc,.)?: Unable Help needed moving to and from a bed to chair (including a wheelchair)?: Total Help needed walking in hospital room?: Total Help needed climbing 3-5 steps with a railing? : Total 6 Click Score: 6     End of Session Equipment Utilized During Treatment: Gait belt Activity Tolerance: Other (comment);Patient limited by pain(limited due to anxiety and pain) Patient left: in bed;with call bell/phone within reach;with bed alarm set;with nursing/sitter in room;with family/visitor present Nurse Communication: Mobility status PT Visit Diagnosis: Pain;Unsteadiness on feet (R26.81);Muscle weakness (generalized) (M62.81);History of falling (Z91.81) Pain - Right/Left: (mid) Pain - part of body: (upper thoracic)    Time: 1308-6578 PT Time Calculation (min) (ACUTE ONLY): 28 min   Charges:   PT Evaluation $PT Eval Moderate Complexity: 1 Mod PT Treatments $Therapeutic Exercise: 8-22 mins   PT G Codes:        Encarnacion Chu PT, DPT 07/03/2017, 11:17 AM

## 2017-07-04 LAB — URINE CULTURE
Culture: >=100000 — AB
Special Requests: NORMAL

## 2017-07-04 MED ORDER — ZINC OXIDE 40 % EX OINT
TOPICAL_OINTMENT | CUTANEOUS | Status: DC | PRN
  Administered 2017-07-04: 14:00:00 via TOPICAL
  Filled 2017-07-04: qty 114

## 2017-07-04 NOTE — Progress Notes (Signed)
Speech Language Pathology Treatment: Dysphagia  Patient Details Name: Sherri Sweeney MRN: [id number] DOB: 28-May-1945 Today's Date: 07/04/2017 Time: 1220-1300 SLP Time Calculation (min) (ACUTE ONLY): 40 min  Assessment / Plan / Recommendation Clinical Impression  Pt seen for ongoing toleration of diet; assessment for need for further diet modification. Also following up w/ family re: education on diet consistency; aspiration precautions; general Reflux precautions; dysphagia and impact on pulmonary status; food options and preparation; food/drink not recommended d/t Reflux or aspiration; reducing distractions during meals.  Pt helping to feed self her lunch meal today w/ Sister assisting. Pt continues to appear to present w/ an adequate oropharyngeal phase swallow function w/ reduced risk for immediate aspiration from an oropharyngeal phase standpoint when following general aspiration precautions and strategies to include NO LARGE STRAWS or Straw use altogether if overt s/s of aspiration are noted during use. Pt consumed po trials of thin liquids via cup/straw and purees/soft solids w/ no immediate, overt s/s of aspiration noted. Post trials, clear vocal quality was noted though pt was instructed to NOT talk as much during the meal(w/ food in her mouth) to lessen risk for choking/aspirating(pt required consistent verbal cues for follow through w/ instruction). Pt required education and monitoring/cues to take smaller sips more slowly - she tended to be impulsive when drinking. During the meal, a mild throat clearing/cough was noted though not consistent or related to the po trials just consumed. Pt was using a straw when drinking at times and often talking. Also noted pt belching - unsure if Reflux needs to be assessed for d/t the s/s of dysmotility(belching, full feelings reported during meals). Due to the throat clearing/cough not increasing in intensity or frequency, po's were continued until pt was full.  No decline in respiratory status overall noted w/ trials. Pt did request to stop d/t feeling "full" after ~25% of the meal.  Recommend continue a modified diet to Dysphagia level 3(mech soft); thin liquids - NO straws if coughing noted during use. General aspiration precautions to include sitting upright; REFLUX precautions. STRONGLY recommend PIlls in PUREE for safer, easier swallowing - NSG/family reported has issues coughing when attempting to swallow pills w/ water. Pt would benefit from some Supervision during meals d/t Cognitive status for better follow through w/ general aspiration precautions. ST services will be available for further education; handouts on diet consistency and precautions given to Sister.     HPI HPI: Pt is a 72 y.o. female with medical history of convulsions, falling(now less mobile), and Reflux(on PPI 2x daily) who is chronically ill and lives at [place] who presents by EMS for evaluation of possible dehydration and possible urinary tract infection.  Reportedly she has had gradually worsening symptoms over the last week that include burning with urination, increased lethargy, increased generalized weakness, inability to get out of bed even with a lift, and altered mental status with increased confusion.  She also reports that she generalized abdominal pain and occasionally has shortness of breath.  She and her family, who provide most of the history, reports that the symptoms are severe and nothing is making them better or worse.  She says that she feels terrible all over but is unable to describe any other symptoms except as described above.  There is no report of fever, chest pain, nor shortness of breath except with exertion. Unsure of pt's baseline Cognitive status; requires verbal cueing and easily distracted.       SLP Plan  Continue with current plan of care  Recommendations  Diet recommendations: Dysphagia 3 (mechanical soft);Thin liquid Liquids provided via:  Cup;No straw(if coughing noted) Medication Administration: Whole meds with puree(for safer swallowing) Supervision: Patient able to self feed;Staff to assist with self feeding;Intermittent supervision to cue for compensatory strategies Compensations: Minimize environmental distractions;Slow rate;Small sips/bites;Lingual sweep for clearance of pocketing;Multiple dry swallows after each bite/sip;Follow solids with liquid Postural Changes and/or Swallow Maneuvers: Seated upright 90 degrees;Upright 30-60 min after meal                General recommendations: (dietician f/u) Oral Care Recommendations: Oral care BID;Staff/trained caregiver to provide oral care;Patient independent with oral care Follow up Recommendations: None SLP Visit Diagnosis: Dysphagia, oropharyngeal phase (R13.12) Plan: Continue with current plan of care       GO                Jerilynn SomKatherine Belle Charlie, MS, CCC-SLP Demoni Gergen 07/04/2017, 2:37 PM

## 2017-07-04 NOTE — Plan of Care (Signed)
  Progressing Health Behavior/Discharge Planning: Ability to manage health-related needs will improve 07/04/2017 0628 - Progressing by Kerrie BuffaloBakare, Tomara Youngberg Muili, RN Clinical Measurements: Ability to maintain clinical measurements within normal limits will improve 07/04/2017 0628 - Progressing by Kerrie BuffaloBakare, Le Ferraz Muili, RN Will remain free from infection 07/04/2017 0628 - Progressing by Kerrie BuffaloBakare, Marvel Mcphillips Muili, RN Diagnostic test results will improve 07/04/2017 0628 - Progressing by Kerrie BuffaloBakare, Giovannie Scerbo Muili, RN Elimination: Will not experience complications related to urinary retention 07/04/2017 0628 - Progressing by Kerrie BuffaloBakare, Asiah Browder Muili, RN Pain Managment: General experience of comfort will improve 07/04/2017 0628 - Progressing by Kerrie BuffaloBakare, Shawnita Krizek Muili, RN Safety: Ability to remain free from injury will improve 07/04/2017 0628 - Progressing by Kerrie BuffaloBakare, Asah Lamay Muili, RN Urinary Elimination: Signs and symptoms of infection will decrease 07/04/2017 0628 - Progressing by Kerrie BuffaloBakare, Mohmmad Saleeby Muili, RN Education: Knowledge of General Education information will improve 07/04/2017 [phone] - Progressing by Kerrie BuffaloBakare, Lakela Kuba Muili, RN Health Behavior/Discharge Planning: Ability to manage health-related needs will improve 07/04/2017 0628 - Progressing by Kerrie BuffaloBakare, Tamarion Haymond Muili, RN Coping: Level of anxiety will decrease 07/04/2017 0628 - Progressing by Kerrie BuffaloBakare, Fatou Dunnigan Muili, RN Elimination: Will not experience complications related to bowel motility 07/04/2017 0628 - Progressing by Kerrie BuffaloBakare, Giannah Zavadil Muili, RN Safety: Ability to remain free from injury will improve 07/04/2017 0628 - Progressing by Kerrie BuffaloBakare, Joshu Furukawa Muili, RN Skin Integrity: Risk for impaired skin integrity will decrease 07/04/2017 0628 - Progressing by Kerrie BuffaloBakare, Chelcy Bolda Muili, RN

## 2017-07-04 NOTE — Progress Notes (Signed)
[facility] Date of Admission:  07/01/2017     ID: Sherri Sweeney is a 72 y.o. female with UTI and Mult drug allergies Active Problems:   Sepsis (HCC)   Subjective: Temp 100.2. Tolerating ceftriaxone. Feels much better today, no abd pain. Foley removed due to some pain with it and since then feels better.  ROS  Eleven systems are reviewed and negative except per hpi  Medications:  Antibiotics Given (last 72 hours)    Date/Time Action Medication Dose Rate   07/01/17 1951 New Bag/Given   aztreonam (AZACTAM) 2 g in sodium chloride 0.9 % 100 mL IVPB 2 g 200 mL/hr   07/02/17 [id number] New Bag/Given   aztreonam (AZACTAM) 1 g in sodium chloride 0.9 % 100 mL IVPB 1 g 200 mL/hr   07/02/17 1316 New Bag/Given   aztreonam (AZACTAM) 1 g in sodium chloride 0.9 % 100 mL IVPB 1 g 200 mL/hr   07/02/17 2153 New Bag/Given   aztreonam (AZACTAM) 1 g in sodium chloride 0.9 % 100 mL IVPB 1 g 200 mL/hr   07/03/17 0450 New Bag/Given   aztreonam (AZACTAM) 1 g in sodium chloride 0.9 % 100 mL IVPB 1 g 200 mL/hr   07/03/17 1454 New Bag/Given   aztreonam (AZACTAM) 1 g in sodium chloride 0.9 % 100 mL IVPB 1 g 200 mL/hr   07/03/17 1741 New Bag/Given   cefTRIAXone (ROCEPHIN) 1 g in sodium chloride 0.9 % 100 mL IVPB 1 g 200 mL/hr     . carbamazepine  200 mg Oral BID WC   And  . carbamazepine  400 mg Oral QHS  . enoxaparin (LOVENOX) injection  40 mg Subcutaneous Q24H  . famotidine  20 mg Oral BID  . folic acid  1 mg Oral Daily  . PHENObarbital  60 mg Oral BID  . potassium chloride SA  20 mEq Oral Daily  . vitamin B-12  500 mcg Oral Daily    Objective: Vital signs in last 24 hours: Temp:  [98.4 F (36.9 C)-100.2 F (37.9 C)] 99.9 F (37.7 C) (02/14 0820) Pulse Rate:  [89-100] 94 (02/14 0820) Resp:  [18-20] 20 (02/14 0820) BP: (102-109)/(50-72) 103/72 (02/14 0820) SpO2:  [92 %-98 %] 94 % (02/14 0820) Constitutional:  Obese, lying in bed, awake and able to converse  some HENT: [place]/AT, PERRLA, no scleral icterus Mouth/Throat: Oropharynx is clear and dry. No oropharyngeal exudate.  Cardiovascular: Normal rate, regular rhythm and normal heart sounds. Pulmonary/Chest: Effort normal and breath sounds normal. No respiratory distress.  has no wheezes.  Neck = supple, no nuchal rigidity Abdominal: Soft. Bowel sounds are normal.  exhibits no distension. There is no tenderness.  Lymphadenopathy: no cervical adenopathy. No axillary adenopathy Neurological, awake and able to converse some Skin: some bruising and abrasions Psychiatric: a normal mood and affect.  behavior is normal.     Lab Results Recent Labs    07/02/17 0701 07/03/17 0430  WBC 10.2  --   HGB 11.9*  --   HCT 35.5  --   NA 131* 133*  K 3.1* 3.4*  CL 93* 100*  CO2 29 25  BUN 23* 11  CREATININE 0.96 0.67    Microbiology: Results for orders placed or performed during the hospital encounter of 07/01/17  Urine Culture     Status: Abnormal (Preliminary result)   Collection Time: 07/01/17  5:56 PM  Result Value Ref Range Status   Specimen Description   Final    URINE,  RANDOM Performed at [facility], [address], [place], [place] [address]    Special Requests   Final    Normal Performed at [facility], [address] [place], [place] [address]    Culture (A)  Final    >=100,000 COLONIES/mL PROTEUS MIRABILIS SUSCEPTIBILITIES TO FOLLOW Performed at [facility], [address], [place], [place] [address]    Report Status PENDING  Incomplete  MRSA PCR Screening     Status: None   Collection Time: 07/02/17  2:20 AM  Result Value Ref Range Status   MRSA by PCR NEGATIVE NEGATIVE Final    Comment:        The GeneXpert MRSA Assay (FDA approved for NASAL specimens only), is one component of a comprehensive MRSA colonization surveillance program. It is not intended to diagnose MRSA infection nor to guide or monitor treatment for MRSA  infections. Performed at [facility], [address] [place], [place] [address]   Culture, blood (x 2)     Status: None (Preliminary result)   Collection Time: 07/02/17 11:02 AM  Result Value Ref Range Status   Specimen Description BLOOD RIGHT HAND  Final   Special Requests   Final    BOTTLES DRAWN AEROBIC AND ANAEROBIC Blood Culture adequate volume   Culture   Final    NO GROWTH 2 DAYS Performed at [facility], [address], [place], [place] [address]    Report Status PENDING  Incomplete  Culture, blood (x 2)     Status: None (Preliminary result)   Collection Time: 07/02/17 11:03 AM  Result Value Ref Range Status   Specimen Description BLOOD LEFT HAND  Final   Special Requests   Final    BOTTLES DRAWN AEROBIC AND ANAEROBIC Blood Culture results may not be optimal due to an inadequate volume of blood received in culture bottles   Culture   Final    NO GROWTH 2 DAYS Performed at [facility], [address], [place], [place] [address]    Report Status PENDING  Incomplete    Studies/Results: No results found.  Assessment/Plan: Sherri Sweeney is a 72 y.o. female with seizures who lives at [place], increasingly immobile per family admitted with AMS and found to have wbc 19, ARF, and Proteus UTI.  She is improving but still complaining of abd pain. She has multiple allergies but per pt and family none of the reactions were severe or involved skin lesions, or anaphylaxis. They were mainly intolerances with nausea and vomiting.  2/14- low grade fever, tolerating ctx. Foley removed, feels much much better, but still weak.  Recommendations Cont ceftriaxone. Continue to follow and will adjust abx based on cx results.  Can dc once sensitivities are back and can select an oral agent, and once maintaining hydration.  Thank you very much for the consult. Will follow with you.  Sherri Sweeney   07/04/2017, 3:18 PM

## 2017-07-04 NOTE — Progress Notes (Signed)
Sherri Sweeney NAME: Sherri Sweeney    MR#:  [id number]  DATE OF BIRTH:  Mar 30, 1946  SUBJECTIVE:   The patient is confused, no complaints.  REVIEW OF SYSTEMS:    Review of Systems  Constitutional: Negative for chills and fever.  HENT: Negative for congestion and tinnitus.   Eyes: Negative for blurred vision and double vision.  Respiratory: Negative for cough, shortness of breath and wheezing.   Cardiovascular: Negative for chest pain, orthopnea and PND.  Gastrointestinal: Negative for abdominal pain, diarrhea, nausea and vomiting.  Genitourinary: Negative for dysuria and hematuria.  Neurological: Negative for dizziness, sensory change and focal weakness.  All other systems reviewed and are negative.   Nutrition: Heart healthy Tolerating Diet: Yes  DRUG ALLERGIES:   Allergies  Allergen Reactions  . Ampicillin   . Ceftin [Cefuroxime Axetil]   . Contrast Media [Iodinated Diagnostic Agents] Itching  . Keflex [Cephalexin]   . Levaquin [Levofloxacin In D5w]   . Nitrofurantoin   . Septra [Sulfamethoxazole-Trimethoprim]   . Vancomycin   . Penicillins Itching and Rash    VITALS:  Blood pressure 103/72, pulse 94, temperature 99.9 F (37.7 C), temperature source Oral, resp. rate 20, height _0  (1.626 m), weight 216 lb 9.6 oz (98.2 kg), SpO2 94 %.  PHYSICAL EXAMINATION:   Physical Exam  GENERAL:  72 y.o.-year-old patient lying in bed in no acute distress.  EYES: Pupils equal, round, reactive to light and accommodation. No scleral icterus. Extraocular muscles intact.  HEENT: Head atraumatic, normocephalic. Oropharynx and nasopharynx clear.  NECK:  Supple, no jugular venous distention. No thyroid enlargement, no tenderness.  LUNGS: Normal breath sounds bilaterally, no wheezing, rales, rhonchi. No use of accessory muscles of respiration.  CARDIOVASCULAR: S1, S2 normal. No murmurs, rubs, or gallops.  ABDOMEN: Soft, nontender,  nondistended. Bowel sounds present. No organomegaly or mass.  EXTREMITIES: No cyanosis, clubbing or edema b/l.    NEUROLOGIC: Cranial nerves II through XII are intact. No focal Motor or sensory deficits b/l. Globally weak  PSYCHIATRIC: The patient is alert and oriented x 1. SKIN: No obvious rash, lesion, or ulcer.    LABORATORY PANEL:   CBC Recent Labs  Lab 07/02/17 0701  WBC 10.2  HGB 11.9*  HCT 35.5  PLT 196   ------------------------------------------------------------------------------------------------------------------  Chemistries  Recent Labs  Lab 07/01/17 1511 07/02/17 0701 07/03/17 0430  NA 131* 131* 133*  K 3.0* 3.1* 3.4*  CL 82* 93* 100*  CO2 _1 GLUCOSE 99 92 83  BUN 32* 23* 11  CREATININE 1.29* 0.96 0.67  CALCIUM 8.7* 7.6* 7.5*  MG  --  1.9  --   AST 29  --   --   ALT 13*  --   --   ALKPHOS 170*  --   --   BILITOT 1.3*  --   --    ------------------------------------------------------------------------------------------------------------------  Cardiac Enzymes Recent Labs  Lab 07/01/17 1511  TROPONINI <0.03   ------------------------------------------------------------------------------------------------------------------  RADIOLOGY:  No results found.   ASSESSMENT AND PLAN:   72 year old female with past medical history of previous history of seizures, GERD, presented to the hospital due to weakness, lethargy, dysuria and also some nausea and vomiting. Patient was noted to have sepsis secondary to UTI.  1. Sepsis-patient met criteria admission given her abnormal urinalysis, tachycardia, leukocytosis. -Source is likely a urinary tract infection. Patient has allergies to multiple antibiotics. She was on aztreonam and changed to Rocephin by Dr. Ola Spurr.  Blood culture is negative so far. U/C: >=100,000 COLONIES/mL PROTEUS MIRABILIS.  Follow-up sensitivity.  2. Urinary tract infection-source of patient's sepsis. Continue IV aztreonam,  U/C: >=100,000 COLONIES/mL PROTEUS MIRABILIS.  Follow-up sensitivity. -Given patient's multiple allergies, ID consult  She was on aztreonam and changed to Rocephin by Dr. Ola Spurr.    3. Leukocytosis-secondary to urinary tract infection-improving with IV antibiotics.  Improved.  4. Hypokalemia- improving with potassium supplement.  5. History of seizures-continue Tegretol and Phenobarb,   Hyponatremia.  Improving with normal saline IV.  PT evaluation suggest skilled nursing facility. All the records are reviewed and case discussed with Care Management/Social Worker. Management plans discussed with the patient, sister and they are in agreement.  CODE STATUS: Full code  DVT Prophylaxis: Lovenox  TOTAL TIME TAKING CARE OF THIS PATIENT: 35 minutes.   POSSIBLE D/C IN 1-2 DAYS, DEPENDING ON CLINICAL CONDITION.   Demetrios Loll M.D on 07/04/2017 at 2:53 PM  Between 7am to 6pm - Pager - [phone]  After 6pm go to [email or web address] - Proofreader  [facility]  Office  [phone]  CC: Primary care physician; Lynnell Jude, MD

## 2017-07-04 NOTE — Progress Notes (Signed)
Per MD patient will likely D/C tomorrow pending urine culture. Clinical Child psychotherapistocial Worker (CSW) contacted [facility] admissions coordinator Bonita QuinLinda and made her aware of above. CSW also made [facility] aware that patient may need IV ABX.   Baker Hughes IncorporatedBailey Bucky Grigg, LCSW [phone]

## 2017-07-05 ENCOUNTER — Inpatient Hospital Stay: Payer: Medicare Other

## 2017-07-05 MED ORDER — CEPHALEXIN 500 MG PO CAPS: 500.0000 mg | ORAL_CAPSULE | Freq: Two times a day (BID) | ORAL | 0 refills | Status: DC

## 2017-07-05 MED ORDER — TORSEMIDE 20 MG PO TABS: 20.0000 mg | ORAL_TABLET | Freq: Once | ORAL | 0 refills | Status: DC

## 2017-07-05 MED ORDER — CEPHALEXIN 500 MG PO CAPS
500.0000 mg | ORAL_CAPSULE | Freq: Two times a day (BID) | ORAL | Status: DC
  Administered 2017-07-05: 500 mg via ORAL
  Filled 2017-07-05: qty 1

## 2017-07-05 MED ORDER — FUROSEMIDE 10 MG/ML IJ SOLN: 20.0000 mg | Freq: Once | INTRAMUSCULAR | Status: DC

## 2017-07-05 MED ORDER — TORSEMIDE 20 MG PO TABS: 20.0000 mg | ORAL_TABLET | Freq: Every day | ORAL | 0 refills | Status: DC

## 2017-07-05 MED ORDER — TORSEMIDE 20 MG PO TABS
20.0000 mg | ORAL_TABLET | Freq: Once | ORAL | Status: AC
  Administered 2017-07-05: 20 mg via ORAL
  Filled 2017-07-05: qty 1

## 2017-07-05 NOTE — Discharge Instructions (Signed)
Mech Soft diet(Dysphagia level 3) w/ Thin liquids; general aspiration precautions to include Sitting Upright for any oral intake and No straws if coughing noted; Supervision during meals for follow through w/ precautions; Reflux precautions  Medication Administration: Whole meds with puree(for safer swallowing)   Aspiration and fall precaution.  Please follow up PCP in SNF this coming week.

## 2017-07-05 NOTE — Discharge Summary (Addendum)
[facility] [facility] NAME: Sherri Sweeney    MR#:  [id number]  DATE OF BIRTH:  [birth year]  DATE OF ADMISSION:  07/01/2017   ADMITTING PHYSICIAN: Demetrios Loll, MD  DATE OF DISCHARGE: 07/05/2017 PRIMARY CARE PHYSICIAN: Lynnell Jude, MD   ADMISSION DIAGNOSIS:  Dehydration [E86.0] Hypokalemia [E87.6] Hyponatremia [E87.1] Acute renal insufficiency [N28.9] Elevated lipase [R74.8] Urinary tract infection with hematuria, site unspecified [N39.0, R31.9] DISCHARGE DIAGNOSIS:  Active Problems:   Sepsis ([place])  SECONDARY DIAGNOSIS:   Past Medical History:  Diagnosis Date  . Falling   . 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 History of convulsions   . Unspecified convulsions ([place])   . Unsteadiness on feet    HOSPITAL COURSE:  72 year old female with past medical history of previous history of seizures, GERD, presented to the hospital due to weakness, lethargy, dysuria and also some nausea and vomiting. Patient was noted to have sepsis secondary to UTI.  1. Sepsis-patient met criteria admission given her abnormal urinalysis, tachycardia, leukocytosis. -Source is likely a urinary tract infection. Patient has allergies to multiple antibiotics. She was on aztreonam and changed to Rocephin by Dr. Ola Spurr.  Blood culture is negative so far. U/C: >=100,000 COLONIES/mL PROTEUS MIRABILIS.    Sensitive to Rocephin and Cipro.  2. Urinary tract infection-source of patient's sepsis. Continue IV aztreonam, U/C: >=100,000 COLONIES/mL PROTEUS MIRABILIS.  Sensitive to Rocephin and Cipro. -Given patient's multiple allergies, ID consult  She was on aztreonam and changed to Rocephin by Dr. Ola Spurr.   Changed to Keflex p.o. for 2 more days.  3. Leukocytosis-secondary to urinary tract infection-improved with IV antibiotics.   4. Hypokalemia- improving with potassium supplement.  5. History of seizures-continue Tegretol and  Phenobarb,   Hyponatremia.  Improving with normal saline IV.  reseume low dose torsemide.  PT evaluation suggest skilled nursing facility.  DISCHARGE CONDITIONS:  Stable, discharge to skilled nursing facility today. CONSULTS OBTAINED:  Treatment Team:  Leonel Ramsay, MD DRUG ALLERGIES:   Allergies  Allergen Reactions  . Ampicillin   . Ceftin [Cefuroxime Axetil]   . Contrast Media [Iodinated Diagnostic Agents] Itching  . Keflex [Cephalexin]   . Levaquin [Levofloxacin In D5w]   . Nitrofurantoin   . Septra [Sulfamethoxazole-Trimethoprim]   . Vancomycin   . Penicillins Itching and Rash   DISCHARGE MEDICATIONS:   Allergies as of 07/05/2017      Reactions   Ampicillin    Ceftin [cefuroxime Axetil]    Contrast Media [iodinated Diagnostic Agents] Itching   Keflex [cephalexin]    Levaquin [levofloxacin In D5w]    Nitrofurantoin    Septra [sulfamethoxazole-trimethoprim]    Vancomycin    Penicillins Itching, Rash      Medication List    STOP taking these medications   vitamin B-12 500 MCG tablet Commonly known as:  CYANOCOBALAMIN     TAKE these medications   acetaminophen 500 MG tablet Commonly known as:  TYLENOL Take 1,000 mg by mouth every 8 (eight) hours as needed.   carbamazepine 200 MG tablet Commonly known as:  TEGRETOL Take 200-400 mg by mouth 3 (three) times daily. Take 200 mg by mouth morning and lunch. Take 400 mg by mouth at bedtime.   cephALEXin 500 MG capsule Commonly known as:  KEFLEX Take 1 capsule (500 mg total) by mouth every 12 (twelve) hours.   D3-1000 PO Take 1,000 Units by mouth daily.   folic acid 1 MG  tablet Commonly known as:  FOLVITE Take 1 mg by mouth daily.   guaiFENesin 100 MG/5ML liquid Commonly known as:  ROBITUSSIN Take 100 mg by mouth every 4 (four) hours as needed for cough.   guaiFENesin 600 MG 12 hr tablet Commonly known as:  MUCINEX Take 600 mg by mouth 2 (two) times daily as needed (congestion).   loperamide 2  MG capsule Commonly known as:  IMODIUM Take 2 mg by mouth as needed for diarrhea or loose stools.   loratadine 10 MG tablet Commonly known as:  CLARITIN Take 10 mg by mouth daily as needed for allergies.   meclizine 25 MG tablet Commonly known as:  ANTIVERT Take 25 mg by mouth 3 (three) times daily as needed for dizziness.   ondansetron 4 MG tablet Commonly known as:  ZOFRAN Take 4 mg by mouth every 6 (six) hours as needed for nausea or vomiting.   PHENObarbital 60 MG tablet Commonly known as:  LUMINAL Take 60 mg by mouth 2 (two) times daily.   potassium chloride SA 20 MEQ tablet Commonly known as:  K-DUR,KLOR-CON Take 20 mEq by mouth daily.   promethazine 25 MG tablet Commonly known as:  PHENERGAN Take 25 mg by mouth every 6 (six) hours as needed for nausea or vomiting.   ranitidine 300 MG tablet Commonly known as:  ZANTAC Take 300 mg by mouth 2 (two) times daily.   torsemide 100 MG tablet Commonly known as:  DEMADEX Take 100 mg by mouth daily. What changed:  Another medication with the same name was added. Make sure you understand how and when to take each.   torsemide 20 MG tablet Commonly known as:  DEMADEX Take 1 tablet (20 mg total) by mouth daily. What changed:  You were already taking a medication with the same name, and this prescription was added. Make sure you understand how and when to take each.        DISCHARGE INSTRUCTIONS:  See AVS.   If you experience worsening of your admission symptoms, develop shortness of breath, life threatening emergency, suicidal or homicidal thoughts you must seek medical attention immediately by calling 911 or calling your MD immediately  if symptoms less severe.  You Must read complete instructions/literature along with all the possible adverse reactions/side effects for all the Medicines you take and that have been prescribed to you. Take any new Medicines after you have completely understood and accpet all the possible  adverse reactions/side effects.   Please note  You were cared for by a hospitalist during your hospital stay. If you have any questions about your discharge medications or the care you received while you were in the hospital after you are discharged, you can call the unit and asked to speak with the hospitalist on call if the hospitalist that took care of you is not available. Once you are discharged, your primary care physician will handle any further medical issues. Please note that NO REFILLS for any discharge medications will be authorized once you are discharged, as it is imperative that you return to your primary care physician (or establish a relationship with a primary care physician if you do not have one) for your aftercare needs so that they can reassess your need for medications and monitor your lab values.    On the day of Discharge:  VITAL SIGNS:  Blood pressure (!) 112/58, pulse 90, temperature (!) 97.5 F (36.4 C), temperature source Oral, resp. rate 20, height _0  (1.626 m), weight  216 lb 9.6 oz (98.2 kg), SpO2 95 %. PHYSICAL EXAMINATION:  GENERAL:  72 y.o.-year-old patient lying in the bed with no acute distress.  EYES: Pupils equal, round, reactive to light and accommodation. No scleral icterus. Extraocular muscles intact.  HEENT: Head atraumatic, normocephalic. Oropharynx and nasopharynx clear.  NECK:  Supple, no jugular venous distention. No thyroid enlargement, no tenderness.  LUNGS: Normal breath sounds bilaterally, no wheezing, rales,rhonchi or crepitation. No use of accessory muscles of respiration.  CARDIOVASCULAR: S1, S2 normal. No murmurs, rubs, or gallops.  ABDOMEN: Soft, non-tender, non-distended. Bowel sounds present. No organomegaly or mass.  EXTREMITIES: No pedal edema, cyanosis, or clubbing.  NEUROLOGIC: Cranial nerves II through XII are intact. Muscle strength 5/5 in all extremities. Sensation intact. Gait not checked.  PSYCHIATRIC: The patient is alert and  oriented x 1.  Demented. SKIN: No obvious rash, lesion, or ulcer.  DATA REVIEW:   CBC Recent Labs  Lab 07/02/17 0701  WBC 10.2  HGB 11.9*  HCT 35.5  PLT 196    Chemistries  Recent Labs  Lab 07/01/17 1511 07/02/17 0701 07/03/17 0430  NA 131* 131* 133*  K 3.0* 3.1* 3.4*  CL 82* 93* 100*  CO2 _0 GLUCOSE 99 92 83  BUN 32* 23* 11  CREATININE 1.29* 0.96 0.67  CALCIUM 8.7* 7.6* 7.5*  MG  --  1.9  --   AST 29  --   --   ALT 13*  --   --   ALKPHOS 170*  --   --   BILITOT 1.3*  --   --      Microbiology Results  Results for orders placed or performed during the hospital encounter of 07/01/17  Urine Culture     Status: Abnormal   Collection Time: 07/01/17  5:56 PM  Result Value Ref Range Status   Specimen Description   Final    URINE, RANDOM Performed at [facility], [address], [place], [place] [address]    Special Requests   Final    Normal Performed at [facility], [address] [place], [place] [address]    Culture >=100,000 COLONIES/mL PROTEUS MIRABILIS (A)  Final   Report Status 07/04/2017 FINAL  Final   Organism ID, Bacteria PROTEUS MIRABILIS (A)  Final      Susceptibility   Proteus mirabilis - MIC*    AMPICILLIN >=32 RESISTANT Resistant     CEFAZOLIN <=4 SENSITIVE Sensitive     CEFTRIAXONE <=1 SENSITIVE Sensitive     CIPROFLOXACIN 1 SENSITIVE Sensitive     GENTAMICIN <=1 SENSITIVE Sensitive     IMIPENEM 1 SENSITIVE Sensitive     NITROFURANTOIN 128 RESISTANT Resistant     TRIMETH/SULFA >=320 RESISTANT Resistant     AMPICILLIN/SULBACTAM 4 SENSITIVE Sensitive     PIP/TAZO <=4 SENSITIVE Sensitive     * >=100,000 COLONIES/mL PROTEUS MIRABILIS  MRSA PCR Screening     Status: None   Collection Time: 07/02/17  2:20 AM  Result Value Ref Range Status   MRSA by PCR NEGATIVE NEGATIVE Final    Comment:        The GeneXpert MRSA Assay (FDA approved for NASAL specimens only), is one component of a comprehensive MRSA  colonization surveillance program. It is not intended to diagnose MRSA infection nor to guide or monitor treatment for MRSA infections. Performed at [facility], [address] [place], [place] [address]   Culture, blood (x 2)     Status: None (Preliminary  result)   Collection Time: 07/02/17 11:02 AM  Result Value Ref Range Status   Specimen Description BLOOD RIGHT HAND  Final   Special Requests   Final    BOTTLES DRAWN AEROBIC AND ANAEROBIC Blood Culture adequate volume   Culture   Final    NO GROWTH 3 DAYS Performed at [facility], [address], [place], [place] [address]    Report Status PENDING  Incomplete  Culture, blood (x 2)     Status: None (Preliminary result)   Collection Time: 07/02/17 11:03 AM  Result Value Ref Range Status   Specimen Description BLOOD LEFT HAND  Final   Special Requests   Final    BOTTLES DRAWN AEROBIC AND ANAEROBIC Blood Culture results may not be optimal due to an inadequate volume of blood received in culture bottles   Culture   Final    NO GROWTH 3 DAYS Performed at [facility], [address] [place], [place] [address]    Report Status PENDING  Incomplete    RADIOLOGY:  No results found.   Management plans discussed with the patient, family and they are in agreement.  CODE STATUS: Full Code   TOTAL TIME TAKING CARE OF THIS PATIENT: 32 minutes.    Demetrios Loll M.D on 07/05/2017 at 8:03 AM  Between 7am to 6pm - Pager - [phone]  After 6pm go to [email or web address] - Proofreader  [facility]  Office  [phone]  CC: Primary care physician; Lynnell Jude, MD   Note: This dictation was prepared with Dragon dictation along with smaller phrase technology. Any transcriptional errors that result from this process are unintentional.

## 2017-07-05 NOTE — Clinical Social Work Note (Addendum)
[facility] to [facility].  Patient and family agreeable to plans will transport via ems RN to call report [phone].  CSW attempted to contact patient's sister to let her know, unable to leave a message.  Windell MouldingEric Hussam Muniz, MSW, Theresia MajorsLCSWA [phone]

## 2017-07-05 NOTE — Progress Notes (Signed)
Speech Language Pathology Treatment: Dysphagia  Patient Details Name: Sherri Sweeney MRN: [id number] DOB: 1945/08/01 Today's Date: 07/05/2017 Time: 9532-0233 SLP Time Calculation (min) (ACUTE ONLY): 45 min  Assessment / Plan / Recommendation Clinical Impression  Pt seen for ongoing assessment of toleration of diet; education. Sisters present in room. Pt lying flat in the bed needing to be positioned more upright for easier breathing and talking w/ others. Upon sitting her upright, pt began to exhibit increased, dry, hacking coughing prior to any oral intake.   Pt consumed trials of thin liquids via Cup, then straw(as pt prefers) w/ no immediate, overt s/s of aspiration noted; no decline in vocal quality or respiratory status during/post trials. Swallows were all timely, and oral phase appeared wfl w/ oral control and clearing w/ the liquids. Educated pt on the need to take rest breaks during meals; avoid any SOB/WOB when eating and drinking AND that she MUST SLOW DOWN when eating/drinking - monitoring during meals if needed d/t impulsivity(Sisters stated pt "has always been impulsive when she eats". Discussed w/ Sisters and pt the need for follow through w/ aspiration precautions including Upright Positioning especially when in bed eating meals(pillows down low behind back); NOT using straws to drink liquids if any coughing noted; dysphagia during extended illness and risk for airway compromise thus pulmonary decline; diet consistency for easier oral intake and reducing exertion overall; food options and preparation w/ condiments. Recommended rest breaks to lessen fatigue/SOB as well as allow for Esophageal clearing; and use of Pills in Puree - Whole as able but crushed if necessary.  Recommend continue w/ current Dysphagia diet as she returns to SNF w/ PILLS IN PUREE. Family agreed; handouts given for information and education. NSG updated and will reconsult if any decline in status while admitted.     HPI HPI: Pt is a 72 y.o. female with medical history of convulsions, falling(now less mobile), and Reflux(on PPI 2x daily) who is chronically ill and lives at [facility] who presents by EMS for evaluation of possible dehydration and possible urinary tract infection.  Reportedly she has had gradually worsening symptoms over the last week that include burning with urination, increased lethargy, increased generalized weakness, inability to get out of bed even with a lift, and altered mental status with increased confusion.  She also reports that she generalized abdominal pain and occasionally has shortness of breath.  She and her family, who provide most of the history, reports that the symptoms are severe and nothing is making them better or worse.  She says that she feels terrible all over but is unable to describe any other symptoms except as described above.  There is no report of fever, chest pain, nor shortness of breath except with exertion. Unsure of pt's baseline Cognitive status; requires verbal cueing and easily distracted.  Family stated pt "always ate impulsively".       SLP Plan  All goals met       Recommendations  Diet recommendations: Dysphagia 3 (mechanical soft);Thin liquid Liquids provided via: Cup(no straw if coughing noted) Medication Administration: Whole meds with puree(for easier, safer swallowing) Supervision: Patient able to self feed;Staff to assist with self feeding;Intermittent supervision to cue for compensatory strategies(and follow through w/ aspiration precautions) Compensations: Minimize environmental distractions;Slow rate;Small sips/bites;Lingual sweep for clearance of pocketing;Multiple dry swallows after each bite/sip;Follow solids with liquid Postural Changes and/or Swallow Maneuvers: Seated upright 90 degrees;Upright 30-60 min after meal(reflux precautions as per family agreement)  General recommendations: (dietician f/u) Oral Care  Recommendations: Oral care BID;Patient independent with oral care;Staff/trained caregiver to provide oral care Follow up Recommendations: None SLP Visit Diagnosis: Dysphagia, oropharyngeal phase (R13.12) Plan: All goals met       GO               [name], MS, CCC-SLP [name],[name] 07/05/2017, 1:00 PM

## 2017-07-05 NOTE — Progress Notes (Signed)
Discharge order received. Patient is alert and oriented. Vital signs stable . No signs of acute distress. Discharge instructions given. Patient verbalized understanding. No other issues noted at this time.   

## 2017-07-05 NOTE — Care Management Important Message (Signed)
Important Message  Patient Details  Name: Sherri Sweeney MRN: [id number] Date of Birth: 06/05/1945   Medicare Important Message Given:  Yes    [name], RN 07/05/2017, 9:04 AM

## 2017-07-07 LAB — CULTURE, BLOOD (ROUTINE X 2)
CULTURE: NO GROWTH
Culture: NO GROWTH
Special Requests: ADEQUATE

## 2017-07-26 ENCOUNTER — Inpatient Hospital Stay
Admission: EM | Admit: 2017-07-26 | Discharge: 2017-07-29 | DRG: 641 | Disposition: A | Discharge status: Skilled Nursing Facility | Payer: Medicare Other | Attending: Internal Medicine | Admitting: Internal Medicine

## 2017-07-26 DIAGNOSIS — Z880 Allergy status to penicillin: Secondary | ICD-10-CM

## 2017-07-26 DIAGNOSIS — N289 Disorder of kidney and ureter, unspecified: Secondary | ICD-10-CM | POA: Diagnosis present

## 2017-07-26 DIAGNOSIS — B9620 Unspecified Escherichia coli [E. coli] as the cause of diseases classified elsewhere: Secondary | ICD-10-CM | POA: Diagnosis present

## 2017-07-26 DIAGNOSIS — Z6832 Body mass index (BMI) 32.0-32.9, adult: Secondary | ICD-10-CM

## 2017-07-26 DIAGNOSIS — R627 Adult failure to thrive: Secondary | ICD-10-CM

## 2017-07-26 DIAGNOSIS — Z7401 Bed confinement status: Secondary | ICD-10-CM

## 2017-07-26 DIAGNOSIS — E871 Hypo-osmolality and hyponatremia: Principal | ICD-10-CM

## 2017-07-26 DIAGNOSIS — E860 Dehydration: Secondary | ICD-10-CM

## 2017-07-26 DIAGNOSIS — I69351 Hemiplegia and hemiparesis following cerebral infarction affecting right dominant side: Secondary | ICD-10-CM

## 2017-07-26 DIAGNOSIS — G40909 Epilepsy, unspecified, not intractable, without status epilepticus: Secondary | ICD-10-CM | POA: Diagnosis present

## 2017-07-26 DIAGNOSIS — Z881 Allergy status to other antibiotic agents status: Secondary | ICD-10-CM

## 2017-07-26 DIAGNOSIS — E861 Hypovolemia: Secondary | ICD-10-CM | POA: Diagnosis present

## 2017-07-26 DIAGNOSIS — Z79899 Other long term (current) drug therapy: Secondary | ICD-10-CM

## 2017-07-26 DIAGNOSIS — Z91041 Radiographic dye allergy status: Secondary | ICD-10-CM

## 2017-07-26 DIAGNOSIS — N390 Urinary tract infection, site not specified: Secondary | ICD-10-CM

## 2017-07-26 DIAGNOSIS — K219 Gastro-esophageal reflux disease without esophagitis: Secondary | ICD-10-CM | POA: Diagnosis present

## 2017-07-26 NOTE — ED Triage Notes (Addendum)
Pt arrived via EMS from [facility] facility with complaints from them of failure to thrive. Facility states that pt is not eating meals and hasn't really eaten for weeks. Pt states that she DOES eat she just doesn't like the food that the facility gives her. She says it's too greasy. EMS reported that facility says she has intermittent confusion, but at the moment in time she is alert and oriented x 4. VS per EMS BP-110/70 HR-90s Os sat-97%RA TEMP-98.8. PT has no complaints of pain. Paperwork at bedside

## 2017-07-27 ENCOUNTER — Other Ambulatory Visit: Payer: Self-pay

## 2017-07-27 ENCOUNTER — Emergency Department: Payer: Medicare Other

## 2017-07-27 DIAGNOSIS — Z881 Allergy status to other antibiotic agents status: Secondary | ICD-10-CM | POA: Diagnosis not present

## 2017-07-27 DIAGNOSIS — E861 Hypovolemia: Secondary | ICD-10-CM | POA: Diagnosis present

## 2017-07-27 DIAGNOSIS — N289 Disorder of kidney and ureter, unspecified: Secondary | ICD-10-CM | POA: Diagnosis present

## 2017-07-27 DIAGNOSIS — R627 Adult failure to thrive: Secondary | ICD-10-CM | POA: Diagnosis present

## 2017-07-27 DIAGNOSIS — G40909 Epilepsy, unspecified, not intractable, without status epilepticus: Secondary | ICD-10-CM | POA: Diagnosis present

## 2017-07-27 DIAGNOSIS — Z6832 Body mass index (BMI) 32.0-32.9, adult: Secondary | ICD-10-CM | POA: Diagnosis not present

## 2017-07-27 DIAGNOSIS — K219 Gastro-esophageal reflux disease without esophagitis: Secondary | ICD-10-CM | POA: Diagnosis present

## 2017-07-27 DIAGNOSIS — E871 Hypo-osmolality and hyponatremia: Secondary | ICD-10-CM | POA: Diagnosis present

## 2017-07-27 DIAGNOSIS — E860 Dehydration: Secondary | ICD-10-CM | POA: Diagnosis present

## 2017-07-27 DIAGNOSIS — B9620 Unspecified Escherichia coli [E. coli] as the cause of diseases classified elsewhere: Secondary | ICD-10-CM | POA: Diagnosis present

## 2017-07-27 DIAGNOSIS — Z7401 Bed confinement status: Secondary | ICD-10-CM | POA: Diagnosis not present

## 2017-07-27 DIAGNOSIS — N390 Urinary tract infection, site not specified: Secondary | ICD-10-CM | POA: Diagnosis present

## 2017-07-27 DIAGNOSIS — I69351 Hemiplegia and hemiparesis following cerebral infarction affecting right dominant side: Secondary | ICD-10-CM | POA: Diagnosis not present

## 2017-07-27 DIAGNOSIS — Z91041 Radiographic dye allergy status: Secondary | ICD-10-CM | POA: Diagnosis not present

## 2017-07-27 DIAGNOSIS — Z880 Allergy status to penicillin: Secondary | ICD-10-CM | POA: Diagnosis not present

## 2017-07-27 DIAGNOSIS — Z79899 Other long term (current) drug therapy: Secondary | ICD-10-CM | POA: Diagnosis not present

## 2017-07-27 LAB — LACTIC ACID, PLASMA: Lactic Acid, Venous: 1.4 mmol/L (ref 0.5–1.9)

## 2017-07-27 LAB — CBC WITH DIFFERENTIAL/PLATELET
BASOS ABS: 0.1 10*3/uL (ref 0–0.1)
Basophils Relative: 1 %
Eosinophils Absolute: 0.3 10*3/uL (ref 0–0.7)
Eosinophils Relative: 3 %
HEMATOCRIT: 40.8 % (ref 35.0–47.0)
Hemoglobin: 13.9 g/dL (ref 12.0–16.0)
LYMPHS ABS: 1.6 10*3/uL (ref 1.0–3.6)
LYMPHS PCT: 19 %
MCH: 31.1 pg (ref 26.0–34.0)
MCHC: 34 g/dL (ref 32.0–36.0)
MCV: 91.3 fL (ref 80.0–100.0)
MONO ABS: 1.4 10*3/uL — AB (ref 0.2–0.9)
Monocytes Relative: 17 %
NEUTROS ABS: 4.9 10*3/uL (ref 1.4–6.5)
Neutrophils Relative %: 60 %
Platelets: 249 10*3/uL (ref 150–440)
RBC: 4.47 MIL/uL (ref 3.80–5.20)
RDW: 14.2 % (ref 11.5–14.5)
WBC: 8.2 10*3/uL (ref 3.6–11.0)

## 2017-07-27 LAB — URINALYSIS, ROUTINE W REFLEX MICROSCOPIC
BILIRUBIN URINE: NEGATIVE
Glucose, UA: NEGATIVE mg/dL
KETONES UR: NEGATIVE mg/dL
Nitrite: NEGATIVE
Protein, ur: NEGATIVE mg/dL
SPECIFIC GRAVITY, URINE: 1.012 (ref 1.005–1.030)
pH: 6 (ref 5.0–8.0)

## 2017-07-27 LAB — COMPREHENSIVE METABOLIC PANEL
ALBUMIN: 3.4 g/dL — AB (ref 3.5–5.0)
ALT: 20 U/L (ref 14–54)
ANION GAP: 13 (ref 5–15)
AST: 27 U/L (ref 15–41)
Alkaline Phosphatase: 74 U/L (ref 38–126)
BUN: 24 mg/dL — ABNORMAL HIGH (ref 6–20)
CO2: 23 mmol/L (ref 22–32)
Calcium: 9 mg/dL (ref 8.9–10.3)
Chloride: 90 mmol/L — ABNORMAL LOW (ref 101–111)
Creatinine, Ser: 1.18 mg/dL — ABNORMAL HIGH (ref 0.44–1.00)
GFR calc Af Amer: 53 mL/min — ABNORMAL LOW (ref >60)
GFR calc non Af Amer: 46 mL/min — ABNORMAL LOW (ref >60)
Glucose, Bld: 110 mg/dL — ABNORMAL HIGH (ref 65–99)
POTASSIUM: 4.2 mmol/L (ref 3.5–5.1)
SODIUM: 126 mmol/L — AB (ref 135–145)
Total Bilirubin: 1.1 mg/dL (ref 0.3–1.2)
Total Protein: 7.7 g/dL (ref 6.5–8.1)

## 2017-07-27 LAB — MAGNESIUM: Magnesium: 1.5 mg/dL — ABNORMAL LOW (ref 1.7–2.4)

## 2017-07-27 LAB — TSH: TSH: 1.275 u[IU]/mL (ref 0.350–4.500)

## 2017-07-27 LAB — LIPASE, BLOOD: Lipase: 47 U/L (ref 11–51)

## 2017-07-27 LAB — MRSA PCR SCREENING: MRSA by PCR: NEGATIVE

## 2017-07-27 LAB — TROPONIN I

## 2017-07-27 MED ORDER — CARBAMAZEPINE 200 MG PO TABS: 200.0000 mg | ORAL_TABLET | Freq: Three times a day (TID) | ORAL | Status: DC

## 2017-07-27 MED ORDER — MECLIZINE HCL 25 MG PO TABS: 25.0000 mg | ORAL_TABLET | Freq: Three times a day (TID) | ORAL | Status: DC | PRN

## 2017-07-27 MED ORDER — TORSEMIDE 20 MG PO TABS
100.0000 mg | ORAL_TABLET | Freq: Every day | ORAL | Status: DC
  Filled 2017-07-27: qty 1

## 2017-07-27 MED ORDER — SODIUM CHLORIDE 0.9 % IV BOLUS (SEPSIS): 1000.0000 mL | INTRAVENOUS | Status: DC

## 2017-07-27 MED ORDER — PHENOBARBITAL 60 MG PO TABS: 60.0000 mg | ORAL_TABLET | Freq: Two times a day (BID) | ORAL | Status: DC

## 2017-07-27 MED ORDER — LORATADINE 10 MG PO TABS: 10.0000 mg | ORAL_TABLET | Freq: Every day | ORAL | Status: DC | PRN

## 2017-07-27 MED ORDER — TORSEMIDE 20 MG PO TABS
20.0000 mg | ORAL_TABLET | Freq: Every day | ORAL | Status: DC
  Filled 2017-07-27: qty 1

## 2017-07-27 MED ORDER — ONDANSETRON HCL 4 MG PO TABS: 4.0000 mg | ORAL_TABLET | Freq: Four times a day (QID) | ORAL | Status: DC | PRN

## 2017-07-27 MED ORDER — HEPARIN SODIUM (PORCINE) 5000 UNIT/ML IJ SOLN
5000.0000 [IU] | Freq: Three times a day (TID) | INTRAMUSCULAR | Status: DC
  Administered 2017-07-27 – 2017-07-29 (×3): 5000 [IU] via SUBCUTANEOUS
  Filled 2017-07-27 (×5): qty 1

## 2017-07-27 MED ORDER — MAGNESIUM SULFATE 2 GM/50ML IV SOLN
2.0000 g | Freq: Once | INTRAVENOUS | Status: AC
Start: 2017-07-27 — End: 2017-07-27
  Administered 2017-07-27: 2 g via INTRAVENOUS
  Filled 2017-07-27: qty 50

## 2017-07-27 MED ORDER — FOLIC ACID 1 MG PO TABS
1.0000 mg | ORAL_TABLET | Freq: Every day | ORAL | Status: DC
  Administered 2017-07-27 – 2017-07-29 (×3): 1 mg via ORAL
  Filled 2017-07-27 (×4): qty 1

## 2017-07-27 MED ORDER — DOCUSATE SODIUM 100 MG PO CAPS
100.0000 mg | ORAL_CAPSULE | Freq: Two times a day (BID) | ORAL | Status: DC
  Administered 2017-07-27 – 2017-07-29 (×5): 100 mg via ORAL
  Filled 2017-07-27 (×5): qty 1

## 2017-07-27 MED ORDER — CARBAMAZEPINE 200 MG PO TABS
200.0000 mg | ORAL_TABLET | Freq: Two times a day (BID) | ORAL | Status: DC
  Administered 2017-07-27 – 2017-07-29 (×5): 200 mg via ORAL
  Filled 2017-07-27 (×8): qty 1

## 2017-07-27 MED ORDER — SODIUM CHLORIDE 0.9 % IV SOLN
1.0000 g | INTRAVENOUS | Status: AC
  Administered 2017-07-27: 1 g via INTRAVENOUS
  Filled 2017-07-27: qty 10

## 2017-07-27 MED ORDER — VITAMIN D 1000 UNITS PO TABS
1000.0000 [IU] | ORAL_TABLET | Freq: Every day | ORAL | Status: DC
  Administered 2017-07-27 – 2017-07-29 (×3): 1000 [IU] via ORAL
  Filled 2017-07-27 (×4): qty 1

## 2017-07-27 MED ORDER — ONDANSETRON HCL 4 MG/2ML IJ SOLN
4.0000 mg | Freq: Four times a day (QID) | INTRAMUSCULAR | Status: DC | PRN
  Administered 2017-07-27 – 2017-07-28 (×2): 4 mg via INTRAVENOUS
  Filled 2017-07-27 (×2): qty 2

## 2017-07-27 MED ORDER — ACETAMINOPHEN 325 MG PO TABS
650.0000 mg | ORAL_TABLET | Freq: Four times a day (QID) | ORAL | Status: DC | PRN
  Filled 2017-07-27: qty 2

## 2017-07-27 MED ORDER — POTASSIUM CHLORIDE CRYS ER 20 MEQ PO TBCR
40.0000 meq | EXTENDED_RELEASE_TABLET | Freq: Every day | ORAL | Status: DC
Start: 2017-07-27 — End: 2017-07-29
  Administered 2017-07-27 – 2017-07-29 (×3): 40 meq via ORAL
  Filled 2017-07-27 (×4): qty 2

## 2017-07-27 MED ORDER — CARBAMAZEPINE 200 MG PO TABS
400.0000 mg | ORAL_TABLET | Freq: Every day | ORAL | Status: DC
  Administered 2017-07-27 – 2017-07-28 (×2): 400 mg via ORAL
  Filled 2017-07-27 (×3): qty 2

## 2017-07-27 MED ORDER — SODIUM CHLORIDE 0.9 % IV SOLN
INTRAVENOUS | Status: DC
  Administered 2017-07-27 – 2017-07-29 (×3): via INTRAVENOUS

## 2017-07-27 MED ORDER — SODIUM CHLORIDE 0.9 % IV BOLUS (SEPSIS)
1000.0000 mL | INTRAVENOUS | Status: AC
  Administered 2017-07-27: 1000 mL via INTRAVENOUS

## 2017-07-27 MED ORDER — PHENOBARBITAL 32.4 MG PO TABS
64.8000 mg | ORAL_TABLET | Freq: Two times a day (BID) | ORAL | Status: DC
  Administered 2017-07-27 – 2017-07-29 (×5): 64.8 mg via ORAL
  Filled 2017-07-27 (×6): qty 2

## 2017-07-27 MED ORDER — BENZONATATE 100 MG PO CAPS
200.0000 mg | ORAL_CAPSULE | Freq: Three times a day (TID) | ORAL | Status: DC
  Administered 2017-07-27 – 2017-07-29 (×5): 200 mg via ORAL
  Filled 2017-07-27 (×6): qty 2

## 2017-07-27 MED ORDER — NYSTATIN 100000 UNIT/GM EX POWD
Freq: Two times a day (BID) | CUTANEOUS | Status: DC
  Administered 2017-07-27 – 2017-07-29 (×5): via TOPICAL
  Filled 2017-07-27: qty 15

## 2017-07-27 MED ORDER — LOPERAMIDE HCL 2 MG PO CAPS: 2.0000 mg | ORAL_CAPSULE | ORAL | Status: DC | PRN

## 2017-07-27 MED ORDER — VITAMIN B-12 1000 MCG PO TABS
500.0000 ug | ORAL_TABLET | Freq: Every day | ORAL | Status: DC
  Administered 2017-07-27 – 2017-07-29 (×3): 500 ug via ORAL
  Filled 2017-07-27 (×4): qty 1

## 2017-07-27 MED ORDER — SODIUM CHLORIDE 0.9 % IV SOLN
2.0000 g | INTRAVENOUS | Status: DC
  Administered 2017-07-28: 2 g via INTRAVENOUS
  Filled 2017-07-27: qty 20

## 2017-07-27 MED ORDER — ACETAMINOPHEN 650 MG RE SUPP: 650.0000 mg | Freq: Four times a day (QID) | RECTAL | Status: DC | PRN

## 2017-07-27 MED ORDER — HYDROCOD POLST-CPM POLST ER 10-8 MG/5ML PO SUER
5.0000 mL | Freq: Two times a day (BID) | ORAL | Status: DC
  Administered 2017-07-27 – 2017-07-28 (×2): 5 mL via ORAL
  Filled 2017-07-27 (×2): qty 5

## 2017-07-27 MED ORDER — GUAIFENESIN 100 MG/5ML PO SOLN
100.0000 mg | ORAL | Status: DC | PRN
  Administered 2017-07-27: 100 mg via ORAL
  Filled 2017-07-27: qty 10
  Filled 2017-07-27: qty 5
  Filled 2017-07-27: qty 10

## 2017-07-27 MED ORDER — FAMOTIDINE 20 MG PO TABS
40.0000 mg | ORAL_TABLET | Freq: Two times a day (BID) | ORAL | Status: DC
  Administered 2017-07-27 – 2017-07-29 (×5): 40 mg via ORAL
  Filled 2017-07-27 (×5): qty 2

## 2017-07-27 NOTE — ED Notes (Signed)
Kathie RhodesBetty (sister) [phone]

## 2017-07-27 NOTE — Progress Notes (Signed)
Pharmacy Antibiotic Note  Sherri Sweeney is a 72 y.o. female admitted on 07/26/2017 with UTI.  Pharmacy has been consulted for ceftriaxone dosing.  Plan: Ceftriaxone 2 grams daily ordered  Height: 5\' 6"  (167.6 cm) Weight: 216 lb (98 kg) IBW/kg (Calculated) : 59.3  Temp (24hrs), Avg:98.8 F (37.1 C), Min:98.8 F (37.1 C), Max:98.8 F (37.1 C)  Recent Labs  Lab 07/27/17 0023 07/27/17 0056  WBC 8.2  --   CREATININE  --  1.18*  LATICACIDVEN 1.4  --     Estimated Creatinine Clearance: 52.4 mL/min (A) (by C-G formula based on SCr of 1.18 mg/dL (H)).    Allergies  Allergen Reactions  . Ampicillin Other (See Comments)    Reaction: unknown  . Ceftin [Cefuroxime Axetil] Other (See Comments)    Reaction: unknown  . Contrast Media [Iodinated Diagnostic Agents] Itching  . Keflex [Cephalexin] Other (See Comments)    Reaction: unknown  . Levaquin [Levofloxacin In D5w] Other (See Comments)    Reaction: unknown  . Nitrofurantoin Other (See Comments)    Reaction: unknown  . Septra [Sulfamethoxazole-Trimethoprim] Other (See Comments)    Reaction: unknown  . Vancomycin Other (See Comments)    Reaction: unknown  . Penicillins Itching and Rash    Antimicrobials this admission: Ceftriaxone 3/9  >>    >>   Dose adjustments this admission:   Microbiology results: 3/9 UCx: pending       3/9 UA: LE(+) NO2(-)  WBC TNTC  Thank you for allowing pharmacy to be a part of this patient's care.  Jacody Beneke S 07/27/2017 3:02 AM

## 2017-07-27 NOTE — Clinical Social Work Note (Signed)
CSW aware via chart review that the patient admitted from [facility]. CSW will assess when able.  Honore Wipperfurth Martha Temprence Rhines, MSW, LCSWA [phone] 

## 2017-07-27 NOTE — Progress Notes (Signed)
[facility] [place] [facility]   PATIENT NAME: Sherri Sweeney    MR#:  [id number]  DATE OF BIRTH:  05-Oct-1945  SUBJECTIVE:  CHIEF COMPLAINT:   Chief Complaint  Patient presents with  . Failure To Thrive   - Chronic right-sided hemiparesis presents from nursing home secondary to poor oral intake and noted to have hyponatremia and UTI -Patient denies any complaints  REVIEW OF SYSTEMS:  Review of Systems  Constitutional: Positive for malaise/fatigue. Negative for chills and fever.  HENT: Negative for congestion, ear discharge, hearing loss and nosebleeds.   Eyes: Negative for blurred vision and double vision.  Respiratory: Negative for cough, shortness of breath and wheezing.   Cardiovascular: Negative for chest pain, palpitations and leg swelling.  Gastrointestinal: Negative for abdominal pain, constipation, diarrhea, nausea and vomiting.  Genitourinary: Negative for dysuria.  Musculoskeletal: Positive for myalgias.  Neurological: Positive for focal weakness. Negative for dizziness, tremors, seizures and headaches.  Psychiatric/Behavioral: Negative for depression.    DRUG ALLERGIES:   Allergies  Allergen Reactions  . Ampicillin Other (See Comments)    Reaction: unknown  . Ceftin [Cefuroxime Axetil] Other (See Comments)    Reaction: unknown  . Contrast Media [Iodinated Diagnostic Agents] Itching  . Keflex [Cephalexin] Other (See Comments)    Reaction: unknown  . Levaquin [Levofloxacin In D5w] Other (See Comments)    Reaction: unknown  . Nitrofurantoin Other (See Comments)    Reaction: unknown  . Septra [Sulfamethoxazole-Trimethoprim] Other (See Comments)    Reaction: unknown  . Vancomycin Other (See Comments)    Reaction: unknown  . Penicillins Itching and Rash    VITALS:  Blood pressure 106/78, pulse 85, temperature (!) 97.5 F (36.4 C), temperature source Oral, resp. rate 16, height 5\' 7"  (1.702 m), weight 94.2 kg (207 lb 10.8 oz), SpO2 98  %.  PHYSICAL EXAMINATION:  Physical Exam  GENERAL:  72 y.o.-year-old patient lying in the bed with no acute distress.  EYES: Pupils equal, round, reactive to light and accommodation. No scleral icterus. Extraocular muscles intact.  HEENT: Head atraumatic, normocephalic. Oropharynx and nasopharynx clear.  NECK:  Supple, no jugular venous distention. No thyroid enlargement, no tenderness.  LUNGS: Normal breath sounds bilaterally, no wheezing, rales,rhonchi or crepitation. No use of accessory muscles of respiration.  CARDIOVASCULAR: S1, S2 normal. No murmurs, rubs, or gallops.  ABDOMEN: Soft, nontender, nondistended. Bowel sounds present. No organomegaly or mass.  EXTREMITIES: No pedal edema, cyanosis, or clubbing.  NEUROLOGIC: Cranial nerves II through XII are intact except right facial droop. Hemiparesis of the right upper extremity and right lower extremity. Able to move left upper extremity 5/5 in left lower extremity 4/5. Sensation intact. Gait not checked.  PSYCHIATRIC: The patient is alert and oriented x 2-3.  SKIN: No obvious rash, lesion, or ulcer.    LABORATORY PANEL:   CBC Recent Labs  Lab 07/27/17 0023  WBC 8.2  HGB 13.9  HCT 40.8  PLT 249   ------------------------------------------------------------------------------------------------------------------  Chemistries  Recent Labs  Lab 07/27/17 0056  NA 126*  K 4.2  CL 90*  CO2 23  GLUCOSE 110*  BUN 24*  CREATININE 1.18*  CALCIUM 9.0  MG 1.5*  AST 27  ALT 20  ALKPHOS 74  BILITOT 1.1   ------------------------------------------------------------------------------------------------------------------  Cardiac Enzymes Recent Labs  Lab 07/27/17 0056  TROPONINI <0.03   ------------------------------------------------------------------------------------------------------------------  RADIOLOGY:  Dg Chest Portable 1 View  Result Date: 07/27/2017 CLINICAL DATA:  Failure to thrive EXAM: PORTABLE CHEST 1 VIEW  COMPARISON:  07/05/2017 FINDINGS: Low lung volumes. No pleural effusion. Normal heart size. No pneumothorax. Possible oval nodule in the right mid lung. IMPRESSION: 1. No radiographic evidence for acute cardiopulmonary abnormality. 2. Possible nodule in the right mid to lower lung. Could be evaluated with dedicated chest CT Electronically Signed   By: Jasmine PangKim  Fujinaga M.D.   On: 07/27/2017 01:22    EKG:   Orders placed or performed during the hospital encounter of 07/01/17  . ED EKG  . ED EKG  . EKG 12-Lead  . EKG 12-Lead  . EKG  . EKG    ASSESSMENT AND PLAN:   72 year old female with past medical history significant for right-sided hemiparesis (since birth per patient), seizure disorder, GERD who is bed bound at baseline was brought in from [facility] nursing home secondary to poor oral intake and dehydration  1. Hyponatremia-hypovolemic hyponatremia. -Continue IV fluids and monitor sodium levels  2. Mild renal insufficiency-secondary to dehydration. Monitor with fluids and avoid nephrotoxins -Hold torsemide  3. Right-sided hemiparesis with seizure disorder-continue antiepileptics. Stable and at baseline  4. UTI-cultures pending on Rocephin at this time  5. DVT prophylaxis-subcutaneous heparin     All the records are reviewed and case discussed with Care Management/Social Workerr. Management plans discussed with the patient, family and they are in agreement.  CODE STATUS: Full code  TOTAL TIME TAKING CARE OF THIS PATIENT: 37 minutes.   POSSIBLE D/C IN 1-2 DAYS, DEPENDING ON CLINICAL CONDITION.   Enid BaasKALISETTI,Marvel Mcphillips M.D on 07/27/2017 at 9:43 AM  Between 7am to 6pm - Pager - [phone]  After 6pm go to [email or web address] - password [email or web address]  [facility]  Office  [phone]  CC: Primary care physician; Dortha KernBliss, Laura K, MD

## 2017-07-27 NOTE — ED Provider Notes (Signed)
[facility] Emergency Department Provider Note  ____________________________________________   First MD Initiated Contact with Patient 07/27/17 0031     (approximate)  I have reviewed the triage vital signs and the nursing notes.   HISTORY  Chief Complaint Failure To Thrive   History is limited by the patient's chronic illness and is status post CVA   HPI Sherri Sweeney is a 72 y.o. female with extensive chronic medical history who lives at [facility] presents by EMS for evaluation of possible dehydration.  She has not been eating or drinking well recently but she says it is because she does not like the food served there.  She had a very similar presentation one month ago and I saw her and diagnosed her with sepsis due to a urinary tract infection.  Reportedly her symptoms have been gradually getting worse over the last few days and are currently severe, but she is not able to provide any history and family is not currently present.  She has chronic right-sided hemiplegia and is essentially bedbound.  She says that she always feels bad but she has no specific complaints at this time and when I asked her if she is in any pain she said no.  She does report that she has been coughing up "stuff" recently but she denies shortness of breath and chest pain.  Past Medical History:  Diagnosis Date  . Falling   . 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 History of convulsions   . Unspecified convulsions (HCC)   . Unsteadiness on feet     Patient Active Problem List   Diagnosis Date Noted  . Sepsis (HCC) 07/01/2017    Past Surgical History:  Procedure Laterality Date  . APPENDECTOMY    . CHOLECYSTECTOMY      Prior to Admission medications   Medication Sig Start Date End Date Taking? Authorizing Provider  acetaminophen (TYLENOL) 500 MG tablet Take 1,000 mg by mouth every 8 (eight) hours as needed.    [provider]    carbamazepine (TEGRETOL) 200 MG tablet Take 200-400 mg by mouth 3 (three) times daily. Take 200 mg by mouth morning and lunch. Take 400 mg by mouth at bedtime.    [provider]  cephALEXin (KEFLEX) 500 MG capsule Take 1 capsule (500 mg total) by mouth every 12 (twelve) hours. 07/05/17   Shaune Pollack, MD  Cholecalciferol (D3-1000 PO) Take 1,000 Units by mouth daily.    [provider]  folic acid (FOLVITE) 1 MG tablet Take 1 mg by mouth daily.    [provider]  guaiFENesin (MUCINEX) 600 MG 12 hr tablet Take 600 mg by mouth 2 (two) times daily as needed (congestion).    [provider]  guaiFENesin (ROBITUSSIN) 100 MG/5ML liquid Take 100 mg by mouth every 4 (four) hours as needed for cough.    [provider]  loperamide (IMODIUM) 2 MG capsule Take 2 mg by mouth as needed for diarrhea or loose stools.    [provider]  loratadine (CLARITIN) 10 MG tablet Take 10 mg by mouth daily as needed for allergies.    [provider]  meclizine (ANTIVERT) 25 MG tablet Take 25 mg by mouth 3 (three) times daily as needed for dizziness.    [provider]  ondansetron (ZOFRAN) 4 MG tablet Take 4 mg by mouth every 6 (six) hours as needed for nausea or vomiting.    [provider]  PHENObarbital (LUMINAL) 60 MG tablet Take 60 mg by mouth 2 (two) times daily.    [provider]  potassium chloride SA (K-DUR,KLOR-CON) 20 MEQ tablet Take 20 mEq by mouth daily.    [provider]  promethazine (PHENERGAN) 25 MG tablet Take 25 mg by mouth every 6 (six) hours as needed for nausea or vomiting.    [provider]  ranitidine (ZANTAC) 300 MG tablet Take 300 mg by mouth 2 (two) times daily.    [provider]  torsemide (DEMADEX) 100 MG tablet Take 100 mg by mouth daily.    [provider]  torsemide (DEMADEX) 20 MG tablet Take 1 tablet (20 mg total) by mouth daily. 07/05/17   Shaune Pollackhen, Qing, MD     Allergies Ampicillin; Ceftin [cefuroxime axetil]; Contrast media [iodinated diagnostic agents]; Keflex [cephalexin]; Levaquin [levofloxacin in d5w]; Nitrofurantoin; Septra [sulfamethoxazole-trimethoprim]; Vancomycin; and Penicillins  History reviewed. No pertinent family history.  Social History Social History   Tobacco Use  . Smoking status: Never Smoker  . Smokeless tobacco: Never Used  Substance Use Topics  . Alcohol use: No    Frequency: Never  . Drug use: No    Review of Systems History is limited by the patient's chronic illness and is status post CVA ____________________________________________   PHYSICAL EXAM:  VITAL SIGNS: ED Triage Vitals  Enc Vitals Group     BP 07/26/17 2357 121/81     Pulse Rate 07/26/17 2357 (!) 108     Resp --      Temp 07/26/17 2357 98.8 F (37.1 C)     Temp Source 07/26/17 2357 Oral     SpO2 07/26/17 2357 95 %     Weight 07/27/17 0000 98 kg (216 lb)     Height 07/27/17 0000 1.676 m (5\' 6" )     Head Circumference --      Peak Flow --      Pain Score 07/27/17 0000 0     Pain Loc --      Pain Edu? --      Excl. in GC? --     Constitutional: Awake and alert, chronically ill, converses appropriately but has limited ability to provide history Eyes: Conjunctivae are normal.  Head: Atraumatic. Nose: No congestion/rhinnorhea. Mouth/Throat: Mucous membranes are dry. Neck: No stridor.  No meningeal signs.   Cardiovascular: Mild tachycardia, regular rhythm. Good peripheral circulation. Grossly normal heart sounds. Respiratory: Normal respiratory effort.  No retractions. Lungs CTAB. Gastrointestinal: Obese.  Soft and nontender. No distention.  Musculoskeletal: No lower extremity tenderness nor edema. No gross deformities of extremities. Neurologic: Chronic right hemiplegia with some coarse movements of the right upper extremity.  Normal movement of the left side of her body. Skin:  Skin is warm, dry and intact. No rash  noted.   ____________________________________________   LABS (all labs ordered are listed, but only abnormal results are displayed)  Labs Reviewed  CBC WITH DIFFERENTIAL/PLATELET - Abnormal; Notable for the following components:      Result Value   Monocytes Absolute 1.4 (*)    All other components within normal limits  URINALYSIS, ROUTINE W REFLEX MICROSCOPIC - Abnormal; Notable for the following components:   Color, Urine AMBER (*)    APPearance CLOUDY (*)    Hgb urine dipstick SMALL (*)    Leukocytes, UA MODERATE (*)    Bacteria, UA RARE (*)    Squamous Epithelial / LPF 6-30 (*)    All other components within normal limits  COMPREHENSIVE METABOLIC  PANEL - Abnormal; Notable for the following components:   Sodium 126 (*)    Chloride 90 (*)    Glucose, Bld 110 (*)    BUN 24 (*)    Creatinine, Ser 1.18 (*)    Albumin 3.4 (*)    GFR calc non Af Amer 46 (*)    GFR calc Af Amer 53 (*)    All other components within normal limits  MAGNESIUM - Abnormal; Notable for the following components:   Magnesium 1.5 (*)    All other components within normal limits  URINE CULTURE  LACTIC ACID, PLASMA  LIPASE, BLOOD  TROPONIN I  LACTIC ACID, PLASMA   ____________________________________________  EKG  None - EKG not ordered by ED physician ____________________________________________  RADIOLOGY Marylou Mccoy, personally viewed and evaluated these images (plain radiographs) as part of my medical decision making, as well as reviewing the written report by the radiologist.  ED MD interpretation:  No acute infiltrate  Official radiology report(s): Dg Chest Portable 1 View  Result Date: 07/27/2017 CLINICAL DATA:  Failure to thrive EXAM: PORTABLE CHEST 1 VIEW COMPARISON:  07/05/2017 FINDINGS: Low lung volumes. No pleural effusion. Normal heart size. No pneumothorax. Possible oval nodule in the right mid lung. IMPRESSION: 1. No radiographic evidence for acute cardiopulmonary  abnormality. 2. Possible nodule in the right mid to lower lung. Could be evaluated with dedicated chest CT Electronically Signed   By: Jasmine Pang M.D.   On: 07/27/2017 01:22    ____________________________________________   PROCEDURES  Critical Care performed: No   Procedure(s) performed:   Procedures   ____________________________________________   INITIAL IMPRESSION / ASSESSMENT AND PLAN / ED COURSE  As part of my medical decision making, I reviewed the following data within the electronic MEDICAL RECORD NUMBER Nursing notes reviewed and incorporated, Labs reviewed , Old chart reviewed, Radiograph reviewed , Discussed with admitting physician  and Notes from prior ED visits    Differential diagnosis includes, but is not limited to, acute infection including viral respiratory illness, pneumonia, intra-abdominal infection, UTI; failure to thrive with dehydration; etc.  CVA and ACS are unlikely given the current presentation.  The patient has had urosepsis in the past and I suspect that is happening again.  As opposed to the visit last month when I saw her and she had moderate tenderness to the abdomen, she has no tenderness today.  We will do an in and out catheterization to obtain a urine sample and are awaiting standard laboratory workup.  CBC is back already and there is no leukocytosis.  Given her report that she is having a productive cough I will check a chest x-ray, but there is no indication for CT scan at this time.  Clinical Course as of Jul 27 220  Sat Jul 27, 2017  0220 The patient's lab results are notable for a urinary tract infection, hypomagnesemia, and hyponatremia.  Her sodium is now 126 and it was in the 130s the last time.  Her creatinine is slightly elevated at 1.18.  Fortunately she has a normal lactic acid and no leukocytosis.  The last time she had a Proteus UTI that was sensitive to cephalosporins and Cipro and she was able to tolerate ceftriaxone and Keflex  without any reaction in spite of her listed allergies; Dr. Sampson Goon with infectious disease saw her and recommended that treatment.  I have ordered a liter of fluids, magnesium sulfate 2 g IV, and ceftriaxone 1 g IV.  Given her chronic illness and  current range of metabolic abnormalities, decreased oral intake, dehydration, and UTI, I think she would benefit from coming into the hospital for at least a brief stay for rehydration and antibiotics.  I discussed the case by phone with Dr. Sheryle Hail with the hospitalist service who will admit.  [CF]    Clinical Course User Index [CF] Loleta Rose, MD    ____________________________________________  FINAL CLINICAL IMPRESSION(S) / ED DIAGNOSES  Final diagnoses:  Dehydration  Failure to thrive in adult  Hyponatremia  Urinary tract infection without hematuria, site unspecified     MEDICATIONS GIVEN DURING THIS VISIT:  Medications  cefTRIAXone (ROCEPHIN) 1 g in sodium chloride 0.9 % 100 mL IVPB (not administered)  magnesium sulfate IVPB 2 g 50 mL (not administered)  sodium chloride 0.9 % bolus 1,000 mL (1,000 mLs Intravenous New Bag/Given 07/27/17 0055)     ED Discharge Orders    None       Note:  This document was prepared using Dragon voice recognition software and may include unintentional dictation errors.    Loleta Rose, MD 07/27/17 Earle Gell

## 2017-07-27 NOTE — H&P (Signed)
Sherri Sweeney is an 72 y.o. female.   Chief Complaint: Possible dehydration HPI: The patient with past medical history of stroke with right hemiparesis presents to the emergency department from her nursing home secondary to failure to thrive/weakness.  She reports that she has not been eating well.  The nursing home was concerned that she may be dehydrated.  Laboratory evaluation showed hyponatremia as well as urinary tract infection.  Due to the patient's general debility and potential for traumatic decline the emergency department staff request of the hospitalist service observe the patient for management of her comorbidities and UTI.  Past Medical History:  Diagnosis Date  . Falling   . 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 History of convulsions   . Unspecified convulsions ([place])   . Unsteadiness on feet     Past Surgical History:  Procedure Laterality Date  . APPENDECTOMY    . CHOLECYSTECTOMY      History reviewed. No pertinent family history. Social History:  reports that  has never smoked. she has never used smokeless tobacco. She reports that she does not drink alcohol or use drugs.  Allergies:  Allergies  Allergen Reactions  . Ampicillin Other (See Comments)    Reaction: unknown  . Ceftin [Cefuroxime Axetil] Other (See Comments)    Reaction: unknown  . Contrast Media [Iodinated Diagnostic Agents] Itching  . Keflex [Cephalexin] Other (See Comments)    Reaction: unknown  . Levaquin [Levofloxacin In D5w] Other (See Comments)    Reaction: unknown  . Nitrofurantoin Other (See Comments)    Reaction: unknown  . Septra [Sulfamethoxazole-Trimethoprim] Other (See Comments)    Reaction: unknown  . Vancomycin Other (See Comments)    Reaction: unknown  . Penicillins Itching and Rash     (Not in a hospital admission)  Results for orders placed or performed during the hospital encounter of 07/26/17 (from the past 48 hour(s))  CBC with  Differential/Platelet     Status: Abnormal   Collection Time: 07/27/17 12:23 AM  Result Value Ref Range   WBC 8.2 3.6 - 11.0 K/uL   RBC 4.47 3.80 - 5.20 MIL/uL   Hemoglobin 13.9 12.0 - 16.0 g/dL   HCT 40.8 35.0 - 47.0 %   MCV 91.3 80.0 - 100.0 fL   MCH 31.1 26.0 - 34.0 pg   MCHC 34.0 32.0 - 36.0 g/dL   RDW 14.2 11.5 - 14.5 %   Platelets 249 150 - 440 K/uL   Neutrophils Relative % 60 %   Neutro Abs 4.9 1.4 - 6.5 K/uL   Lymphocytes Relative 19 %   Lymphs Abs 1.6 1.0 - 3.6 K/uL   Monocytes Relative 17 %   Monocytes Absolute 1.4 (H) 0.2 - 0.9 K/uL   Eosinophils Relative 3 %   Eosinophils Absolute 0.3 0 - 0.7 K/uL   Basophils Relative 1 %   Basophils Absolute 0.1 0 - 0.1 K/uL    Comment: Performed at [facility], [address] [place], [place] [address]  Lactic acid, plasma     Status: None   Collection Time: 07/27/17 12:23 AM  Result Value Ref Range   Lactic Acid, Venous 1.4 0.5 - 1.9 mmol/L    Comment: Performed at [facility], [address] [place], [place] [address]  Urinalysis, Routine w reflex microscopic     Status: Abnormal   Collection Time: 07/27/17 12:23 AM  Result Value Ref Range   Color, Urine AMBER (A) YELLOW  Comment: BIOCHEMICALS MAY BE AFFECTED BY COLOR   APPearance CLOUDY (A) CLEAR   Specific Gravity, Urine 1.012 1.005 - 1.030   pH 6.0 5.0 - 8.0   Glucose, UA NEGATIVE NEGATIVE mg/dL   Hgb urine dipstick SMALL (A) NEGATIVE   Bilirubin Urine NEGATIVE NEGATIVE   Ketones, ur NEGATIVE NEGATIVE mg/dL   Protein, ur NEGATIVE NEGATIVE mg/dL   Nitrite NEGATIVE NEGATIVE   Leukocytes, UA MODERATE (A) NEGATIVE   RBC / HPF 0-5 0 - 5 RBC/hpf   WBC, UA TOO NUMEROUS TO COUNT 0 - 5 WBC/hpf   Bacteria, UA RARE (A) NONE SEEN   Squamous Epithelial / LPF 6-30 (A) NONE SEEN    Comment: Performed at [facility], [address] [place], [place] [address]  Comprehensive metabolic panel     Status: Abnormal   Collection Time: 07/27/17 12:56  AM  Result Value Ref Range   Sodium 126 (L) 135 - 145 mmol/L   Potassium 4.2 3.5 - 5.1 mmol/L   Chloride 90 (L) 101 - 111 mmol/L   CO2 23 22 - 32 mmol/L   Glucose, Bld 110 (H) 65 - 99 mg/dL   BUN 24 (H) 6 - 20 mg/dL   Creatinine, Ser 1.18 (H) 0.44 - 1.00 mg/dL   Calcium 9.0 8.9 - 10.3 mg/dL   Total Protein 7.7 6.5 - 8.1 g/dL   Albumin 3.4 (L) 3.5 - 5.0 g/dL   AST 27 15 - 41 U/L   ALT 20 14 - 54 U/L   Alkaline Phosphatase 74 38 - 126 U/L   Total Bilirubin 1.1 0.3 - 1.2 mg/dL   GFR calc non Af Amer 46 (L) >60 mL/min   GFR calc Af Amer 53 (L) >60 mL/min    Comment: (NOTE) The eGFR has been calculated using the CKD EPI equation. This calculation has not been validated in all clinical situations. eGFR's persistently <60 mL/min signify possible Chronic Kidney Disease.    Anion gap 13 5 - 15    Comment: Performed at [facility], [address] [place], [place] [address]  Lipase, blood     Status: None   Collection Time: 07/27/17 12:56 AM  Result Value Ref Range   Lipase 47 11 - 51 U/L    Comment: Performed at [facility], [address] [place], [place] [address]  Magnesium     Status: Abnormal   Collection Time: 07/27/17 12:56 AM  Result Value Ref Range   Magnesium 1.5 (L) 1.7 - 2.4 mg/dL    Comment: Performed at [facility], [address] [place], [place] [address]  Troponin I     Status: None   Collection Time: 07/27/17 12:56 AM  Result Value Ref Range   Troponin I <0.03 <0.03 ng/mL    Comment: Performed at [facility], [address] [place], [place] [address]   Dg Chest Portable 1 View  Result Date: 07/27/2017 CLINICAL DATA:  Failure to thrive EXAM: PORTABLE CHEST 1 VIEW COMPARISON:  07/05/2017 FINDINGS: Low lung volumes. No pleural effusion. Normal heart size. No pneumothorax. Possible oval nodule in the right mid lung. IMPRESSION: 1. No radiographic evidence for acute cardiopulmonary abnormality. 2. Possible nodule in the  right mid to lower lung. Could be evaluated with dedicated chest CT Electronically Signed   By: Donavan Foil M.D.   On: 07/27/2017 01:22    Review of Systems  Constitutional: Negative for chills and fever.  HENT: Negative for sore throat and tinnitus.  Eyes: Negative for blurred vision and redness.  Respiratory: Negative for cough and shortness of breath.   Cardiovascular: Negative for chest pain, palpitations, orthopnea and PND.  Gastrointestinal: Positive for nausea. Negative for abdominal pain, diarrhea and vomiting.  Genitourinary: Negative for dysuria, frequency and urgency.  Musculoskeletal: Negative for joint pain and myalgias.  Skin: Negative for rash.       No lesions  Neurological: Negative for speech change, focal weakness and weakness.  Endo/Heme/Allergies: Does not bruise/bleed easily.       No temperature intolerance  Psychiatric/Behavioral: Negative for depression and suicidal ideas.    Blood pressure (!) 129/102, pulse 93, temperature 98.8 F (37.1 C), temperature source Oral, height 5' 6"  (1.676 m), weight 98 kg (216 lb), SpO2 96 %. Physical Exam  Vitals reviewed. Constitutional: She is oriented to person, place, and time. She appears well-developed and well-nourished. No distress.  HENT:  Head: Normocephalic and atraumatic.  Mouth/Throat: Oropharynx is clear and moist.  Eyes: Conjunctivae and EOM are normal. Pupils are equal, round, and reactive to light. No scleral icterus.  Neck: Normal range of motion. Neck supple. No JVD present. No tracheal deviation present. No thyromegaly present.  Cardiovascular: Normal rate, regular rhythm and normal heart sounds. Exam reveals no gallop and no friction rub.  No murmur heard. Respiratory: Effort normal and breath sounds normal.  GI: Soft. Bowel sounds are normal. She exhibits no distension. There is no tenderness.  Genitourinary:  Genitourinary Comments: Deferred  Musculoskeletal: Normal range of motion. She exhibits no  edema.  Lymphadenopathy:    She has no cervical adenopathy.  Neurological: She is alert and oriented to person, place, and time. No cranial nerve deficit. She exhibits normal muscle tone.  Skin: Skin is warm and dry. No rash noted. No erythema.  Psychiatric: She has a normal mood and affect. Her behavior is normal. Judgment and thought content normal.     Assessment/Plan This is a 72 year old female admitted for hyponatremia. 1.  Hyponatremia: Hydrate with intravenous fluid.  No mental status changes 2.  Acute kidney injury: Secondary to dehydration.  Plan as above 3.  UTI: Present on admission.  No signs or symptoms of sepsis.  Previous urinary tract infection with Proteus mirabilis sensitive to ceftriaxone.  Continue ceftriaxone. 4.  DVT prophylaxis: Heparin 5.  GI prophylaxis: Pepcid per home regimen The patient is a full code.  Time spent on admission orders and patient care approximately 45 minutes  Harrie Foreman, MD 07/27/2017, 3:36 AM

## 2017-07-27 NOTE — Progress Notes (Signed)
Pt has poor p.o. Intake today despite encouragement, she is refusing dinner. She has also experienced two episodes of emesis throughout this shift. Pt resting comfortably with no complaints at this time

## 2017-07-28 LAB — HEMOGLOBIN A1C
Hgb A1c MFr Bld: 5.9 % — ABNORMAL HIGH (ref 4.8–5.6)
Mean Plasma Glucose: 123 mg/dL

## 2017-07-28 LAB — BASIC METABOLIC PANEL
Anion gap: 9 (ref 5–15)
BUN: 12 mg/dL (ref 6–20)
CHLORIDE: 97 mmol/L — AB (ref 101–111)
CO2: 22 mmol/L (ref 22–32)
Calcium: 8.6 mg/dL — ABNORMAL LOW (ref 8.9–10.3)
Creatinine, Ser: 0.72 mg/dL (ref 0.44–1.00)
Glucose, Bld: 103 mg/dL — ABNORMAL HIGH (ref 65–99)
POTASSIUM: 3.7 mmol/L (ref 3.5–5.1)
SODIUM: 128 mmol/L — AB (ref 135–145)

## 2017-07-28 MED ORDER — SODIUM CHLORIDE 0.9 % IV SOLN
1.0000 g | INTRAVENOUS | Status: DC
  Administered 2017-07-29: 1 g via INTRAVENOUS
  Filled 2017-07-28 (×2): qty 10

## 2017-07-28 NOTE — Progress Notes (Addendum)
[facility] - [place] [facility]   PATIENT NAME: Sherri Sweeney    MR#:  [id number]  DATE OF BIRTH:  1945/12/09  SUBJECTIVE:  CHIEF COMPLAINT:   Chief Complaint  Patient presents with  . Failure To Thrive   - Still sleepy this morning. 2 episodes of vomiting yesterday. Cough is improved today.  REVIEW OF SYSTEMS:  Review of Systems  Constitutional: Positive for malaise/fatigue. Negative for chills and fever.  HENT: Negative for congestion, ear discharge, hearing loss and nosebleeds.   Eyes: Negative for blurred vision and double vision.  Respiratory: Negative for cough, shortness of breath and wheezing.   Cardiovascular: Negative for chest pain, palpitations and leg swelling.  Gastrointestinal: Positive for nausea and vomiting. Negative for abdominal pain, constipation and diarrhea.  Genitourinary: Negative for dysuria.  Musculoskeletal: Positive for myalgias.  Neurological: Positive for focal weakness. Negative for dizziness, tremors, seizures and headaches.  Psychiatric/Behavioral: Negative for depression.    DRUG ALLERGIES:   Allergies  Allergen Reactions  . Ampicillin Other (See Comments)    Reaction: unknown  . Ceftin [Cefuroxime Axetil] Other (See Comments)    Reaction: unknown  . Contrast Media [Iodinated Diagnostic Agents] Itching  . Keflex [Cephalexin] Other (See Comments)    Reaction: unknown  . Levaquin [Levofloxacin In D5w] Other (See Comments)    Reaction: unknown  . Nitrofurantoin Other (See Comments)    Reaction: unknown  . Septra [Sulfamethoxazole-Trimethoprim] Other (See Comments)    Reaction: unknown  . Vancomycin Other (See Comments)    Reaction: unknown  . Penicillins Itching and Rash    VITALS:  Blood pressure (!) 105/59, pulse 82, temperature 98.3 F (36.8 C), temperature source Oral, resp. rate 16, height 5\' 7"  (1.702 m), weight 95.8 kg (211 lb 3.2 oz), SpO2 99 %.  PHYSICAL EXAMINATION:  Physical Exam  GENERAL:  72  y.o.-year-old patient lying in the bed with no acute distress.  EYES: Pupils equal, round, reactive to light and accommodation. No scleral icterus. Extraocular muscles intact.  HEENT: Head atraumatic, normocephalic. Oropharynx and nasopharynx clear.  NECK:  Supple, no jugular venous distention. No thyroid enlargement, no tenderness.  LUNGS: Normal breath sounds bilaterally, no wheezing, rales,rhonchi or crepitation. No use of accessory muscles of respiration.  CARDIOVASCULAR: S1, S2 normal. No murmurs, rubs, or gallops.  ABDOMEN: Soft, nontender, nondistended. Bowel sounds present. No organomegaly or mass.  EXTREMITIES: No pedal edema, cyanosis, or clubbing.  NEUROLOGIC: Cranial nerves II through XII are intact except right facial droop. Hemiparesis of the right upper extremity and right lower extremity. Able to move left upper extremity 5/5 in left lower extremity 4/5. Sensation intact. Gait not checked.  PSYCHIATRIC: The patient is alert and oriented x 2-3.  SKIN: No obvious rash, lesion, or ulcer.    LABORATORY PANEL:   CBC Recent Labs  Lab 07/27/17 0023  WBC 8.2  HGB 13.9  HCT 40.8  PLT 249   ------------------------------------------------------------------------------------------------------------------  Chemistries  Recent Labs  Lab 07/27/17 0056 07/28/17 0424  NA 126* 128*  K 4.2 3.7  CL 90* 97*  CO2 23 22  GLUCOSE 110* 103*  BUN 24* 12  CREATININE 1.18* 0.72  CALCIUM 9.0 8.6*  MG 1.5*  --   AST 27  --   ALT 20  --   ALKPHOS 74  --   BILITOT 1.1  --    ------------------------------------------------------------------------------------------------------------------  Cardiac Enzymes Recent Labs  Lab 07/27/17 0056  TROPONINI <0.03   ------------------------------------------------------------------------------------------------------------------  RADIOLOGY:  Dg Chest Portable  1 View  Result Date: 07/27/2017 CLINICAL DATA:  Failure to thrive EXAM:  PORTABLE CHEST 1 VIEW COMPARISON:  07/05/2017 FINDINGS: Low lung volumes. No pleural effusion. Normal heart size. No pneumothorax. Possible oval nodule in the right mid lung. IMPRESSION: 1. No radiographic evidence for acute cardiopulmonary abnormality. 2. Possible nodule in the right mid to lower lung. Could be evaluated with dedicated chest CT Electronically Signed   By: Jasmine PangKim  Fujinaga M.D.   On: 07/27/2017 01:22    EKG:   Orders placed or performed during the hospital encounter of 07/01/17  . ED EKG  . ED EKG  . EKG 12-Lead  . EKG 12-Lead  . EKG  . EKG    ASSESSMENT AND PLAN:   [age] year old female with past medical history significant for right-sided hemiparesis (since birth per patient), seizure disorder, GERD who is bed bound at baseline was brought in from [facility] secondary to poor oral intake and dehydration  1. Hyponatremia-hypovolemic hyponatremia. -Improving with IV fluids -Continue IV fluids and monitor sodium levels  2. Mild renal insufficiency-secondary to dehydration. Monitor with fluids and avoid nephrotoxins -Hold torsemide  3. Right-sided hemiparesis with seizure disorder-continue antiepileptics. Stable and at baseline  4. UTI-cultures pending on Rocephin at this time  5. DVT prophylaxis-subcutaneous heparin   6. Cough-chest x-ray on admission did not show any acute findings. Could be just bronchitis or upper airway infection symptoms -Cough medications added. Continue to monitor. Remains on room air  If stable, labs are improved, likely discharge [facility] tomorrow Tried calling her sister Kathie RhodesBetty, but couldn't leave a voicemail   All the records are reviewed and case discussed with Care Management/Social Workerr. Management plans discussed with the patient, family and they are in agreement.  CODE STATUS: Full code  TOTAL TIME TAKING CARE OF THIS PATIENT: 36 minutes.   POSSIBLE D/C IN 1-2 DAYS, DEPENDING ON CLINICAL  CONDITION.   Enid BaasKALISETTI,Zaydon Kinser M.D on 07/28/2017 at 9:17 AM  Between 7am to 6pm - Pager - [phone]  After 6pm go to [email or web address] - password [email or web address]  [facility]  Office  [phone]  CC: Primary care physician; Dortha KernBliss, Laura K, MD

## 2017-07-28 NOTE — NC FL2 (Signed)
[place] MEDICAID FL2 LEVEL OF CARE SCREENING TOOL     IDENTIFICATION  Patient Name: Sherri Sweeney Birthdate: Mar 05, 1946 Sex: female Admission Date (Current Location): 07/26/2017  [place] and [place] Number:  [address] [id number] [facility] Facility and Address:  [facility], [address], [place], [place] [address]      Provider Number: [id number]  Attending Physician Name and Address:  Enid Baas, MD  Relative Name and Phone Number:  Margarita Grizzle (sister) [phone]    Current Level of Care: Hospital Recommended Level of Care: Skilled Nursing Facility Prior Approval Number:    Date Approved/Denied:   PASRR Number: [id number]  Discharge Plan: SNF    Current Diagnoses: Patient Active Problem List   Diagnosis Date Noted  . Hyponatremia 07/27/2017  . Sepsis (HCC) 07/01/2017    Orientation RESPIRATION BLADDER Height & Weight     Self, Time, Situation, Place  Normal Incontinent Weight: 211 lb 3.2 oz (95.8 kg) Height:  5\' 7"  (170.2 cm)  BEHAVIORAL SYMPTOMS/MOOD NEUROLOGICAL BOWEL NUTRITION STATUS      Continent Diet(Heart Healthy)  AMBULATORY STATUS COMMUNICATION OF NEEDS Skin   Extensive Assist Verbally Normal                       Personal Care Assistance Level of Assistance  Bathing, Feeding, Dressing Bathing Assistance: Limited assistance Feeding assistance: Independent Dressing Assistance: Limited assistance     Functional Limitations Info  Sight, Hearing, Speech Sight Info: Adequate Hearing Info: Adequate Speech Info: Adequate    SPECIAL CARE FACTORS FREQUENCY  PT (By licensed PT), OT (By licensed OT)     PT Frequency: Up to 5X per day, 5 days per week OT Frequency: Up to 5X per day, 5 days per week            Contractures Contractures Info: Not present    Additional Factors Info  Code Status, Psychotropic Code Status Info: Full Allergies Info: Ampicillin, Ceftin Cefuroxime Axetil, Contrast Media  Iodinated Diagnostic Agents, Keflex Cephalexin, Levaquin Levofloxacin In D5w, Nitrofurantoin, Septra Sulfamethoxazole-trimethoprim, Vancomycin, Penicillins Psychotropic Info: Luminal         Current Medications (07/28/2017):  This is the current hospital active medication list Current Facility-Administered Medications  Medication Dose Route Frequency Provider Last Rate Last Dose  . 0.9 %  sodium chloride infusion   Intravenous Continuous Enid Baas, MD 75 mL/hr at 07/28/17 0755    . acetaminophen (TYLENOL) tablet 650 mg  650 mg Oral Q6H PRN Arnaldo Natal, MD       Or  . acetaminophen (TYLENOL) suppository 650 mg  650 mg Rectal Q6H PRN Arnaldo Natal, MD      . benzonatate (TESSALON) capsule 200 mg  200 mg Oral TID Enid Baas, MD   200 mg at 07/28/17 0937  . carbamazepine (TEGRETOL) tablet 200 mg  200 mg Oral BID Arnaldo Natal, MD   200 mg at 07/28/17 8413  . carbamazepine (TEGRETOL) tablet 400 mg  400 mg Oral QHS Arnaldo Natal, MD   400 mg at 07/27/17 2159  . [START ON 07/29/2017] cefTRIAXone (ROCEPHIN) 1 g in sodium chloride 0.9 % 100 mL IVPB  1 g Intravenous Q24H Hallaji, Sheema M, RPH      . cholecalciferol (VITAMIN D) tablet 1,000 Units  1,000 Units Oral Daily Arnaldo Natal, MD   1,000 Units at 07/28/17 [phone]  . docusate sodium (COLACE) capsule 100 mg  100 mg Oral BID Arnaldo Natal, MD   100  mg at 07/28/17 0938  . famotidine (PEPCID) tablet 40 mg  40 mg Oral BID Arnaldo Nataliamond, Michael S, MD   40 mg at 07/28/17 [id number]  . folic acid (FOLVITE) tablet 1 mg  1 mg Oral Daily Arnaldo Nataliamond, Michael S, MD   1 mg at 07/28/17 [id number]  . heparin injection 5,000 Units  5,000 Units Subcutaneous Q8H Arnaldo Nataliamond, Michael S, MD   5,000 Units at 07/27/17 1523  . loperamide (IMODIUM) capsule 2 mg  2 mg Oral PRN Arnaldo Nataliamond, Michael S, MD      . loratadine (CLARITIN) tablet 10 mg  10 mg Oral Daily PRN Arnaldo Nataliamond, Michael S, MD      . meclizine (ANTIVERT) tablet 25 mg  25 mg Oral TID PRN  Arnaldo Nataliamond, Michael S, MD      . nystatin (MYCOSTATIN/NYSTOP) topical powder   Topical BID Enid BaasKalisetti, Radhika, MD      . ondansetron [facility](ZOFRAN) tablet 4 mg  4 mg Oral Q6H PRN Arnaldo Nataliamond, Michael S, MD       Or  . ondansetron [facility](ZOFRAN) injection 4 mg  4 mg Intravenous Q6H PRN Arnaldo Nataliamond, Michael S, MD   4 mg at 07/27/17 1045  . PHENobarbital (LUMINAL) tablet 64.8 mg  64.8 mg Oral BID Enid BaasKalisetti, Radhika, MD   64.8 mg at 07/28/17 0937  . potassium chloride SA (K-DUR,KLOR-CON) CR tablet 40 mEq  40 mEq Oral Daily Arnaldo Nataliamond, Michael S, MD   40 mEq at 07/28/17 [id number]  . vitamin B-12 (CYANOCOBALAMIN) tablet 500 mcg  500 mcg Oral Daily Arnaldo Nataliamond, Michael S, MD   500 mcg at 07/28/17 [id number]     Discharge Medications: Please see discharge summary for a list of discharge medications.  Relevant Imaging Results:  Relevant Lab Results:   Additional Information SS#[id number]  Judi CongKaren M Dickson Kostelnik, LCSW

## 2017-07-28 NOTE — Clinical Social Work Note (Signed)
Clinical Social Work Assessment  Patient Details  Name: Sherri Sweeney MRN: [id number] Date of Birth: 03/14/1946  Date of referral:  07/28/17               Reason for consult:  Facility Placement                Permission sought to share information with:  Chartered certified accountant granted to share information::  Yes, Verbal Permission Granted  Name::        Agency::  [facility]  Relationship::     Contact Information:     Housing/Transportation Living arrangements for the past 2 months:  [place] of Information:  Patient, Medical Team, Siblings Patient Interpreter Needed:  None Criminal Activity/Legal Involvement Pertinent to Current Situation/Hospitalization:  No - Comment as needed Significant Relationships:  Warehouse manager, Siblings Lives with:  Facility Resident Do you feel safe going back to the place where you live?  Yes Need for family participation in patient care:  No (Coment)  Care giving concerns:  Patient admitted from [place]   Social Worker assessment / plan: CSW met with the patient and her sister, Sherri Sweeney, at bedside to discuss discharge planning. The patient is a LTC resident of [facility] and has been for almost 4 years. The patient and her family would like the patient to return when stable via non-emergent EMS.   The plan is for the patient to discharge either tomorrow or Tuesday. The CSW will follow to facilitate discharge.  Employment status:  Retired Forensic scientist:  Medicare PT Recommendations:  Not assessed at this time Information / Referral to community resources:     Patient/Family's Response to care: The patient and her family thanked the CSW.  Patient/Family's Understanding of and Emotional Response to Diagnosis, Current Treatment, and Prognosis:  The patient and her family are in agreement with the discharge plan back to the originating facility.  Emotional  Assessment Appearance:  Appears stated age Attitude/Demeanor/Rapport:  Lethargic Affect (typically observed):  Pleasant Orientation:  Oriented to Self, Oriented to Place, Oriented to  Time, Oriented to Situation Alcohol / Substance use:  Never Used Psych involvement (Current and /or in the community):  No (Comment)  Discharge Needs  Concerns to be addressed:  Care Coordination, Discharge Planning Concerns Readmission within the last 30 days:  Yes Current discharge risk:  Chronically ill Barriers to Discharge:  Continued Medical Work up   [employer], LCSW 07/28/2017, 3:03 PM

## 2017-07-28 NOTE — Progress Notes (Signed)
Pt. 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 Pt was able to tolerate ice cream today and family brought her a vanilla shake which she tolerated well.

## 2017-07-28 NOTE — Progress Notes (Signed)
Pharmacy Antibiotic Note  Sherri Sweeney is a 72 y.o. female admitted on 07/26/2017 with UTI.  Pharmacy has been consulted for ceftriaxone dosing.  Plan: Ceftriaxone 1 grams daily ordered  Height: 5\' 7"  (170.2 cm) Weight: 211 lb 3.2 oz (95.8 kg) IBW/kg (Calculated) : 61.6  Temp (24hrs), Avg:98.4 F (36.9 C), Min:98.3 F (36.8 C), Max:98.5 F (36.9 C)  Recent Labs  Lab 07/27/17 0023 07/27/17 0056 07/28/17 0424  WBC 8.2  --   --   CREATININE  --  1.18* 0.72  LATICACIDVEN 1.4  --   --     Estimated Creatinine Clearance: 77.8 mL/min (by C-G formula based on SCr of 0.72 mg/dL).    Allergies  Allergen Reactions  . Ampicillin Other (See Comments)    Reaction: unknown  . Ceftin [Cefuroxime Axetil] Other (See Comments)    Reaction: unknown  . Contrast Media [Iodinated Diagnostic Agents] Itching  . Keflex [Cephalexin] Other (See Comments)    Reaction: unknown  . Levaquin [Levofloxacin In D5w] Other (See Comments)    Reaction: unknown  . Nitrofurantoin Other (See Comments)    Reaction: unknown  . Septra [Sulfamethoxazole-Trimethoprim] Other (See Comments)    Reaction: unknown  . Vancomycin Other (See Comments)    Reaction: unknown  . Penicillins Itching and Rash    Antimicrobials this admission: Ceftriaxone 3/9  >>    >>   Dose adjustments this admission:   Microbiology results: 3/9 UCx: pending       3/9 UA: LE(+) NO2(-)  WBC TNTC  Thank you for allowing pharmacy to be a part of this patient's care.  Mardene SpeakSheema M Breyer Tejera 07/28/2017 2:46 PM

## 2017-07-28 NOTE — Progress Notes (Signed)
IV access loss. Attempted to get IV four times by several RNs. Unsuccessful. IV team consult placed.

## 2017-07-28 NOTE — Progress Notes (Signed)
PT Cancellation Note  Patient Details Name: Sherri Sweeney MRN: [id number] DOB: 01/29/1946   Cancelled Treatment:    Reason Eval/Treat Not Completed: Fatigue/lethargy limiting ability to participate.  Too lethargic to awaken this AM and may try later as pt can allow.   Ivar DrapeRuth E Eilyn Polack 07/28/2017, 1:22 PM   Samul Dadauth Zhaire Locker, PT MS Acute Rehab Dept. Number: [facility] [phone] and [facility] [phone]

## 2017-07-29 LAB — BASIC METABOLIC PANEL
Anion gap: 9 (ref 5–15)
BUN: 9 mg/dL (ref 6–20)
CALCIUM: 8.4 mg/dL — AB (ref 8.9–10.3)
CO2: 19 mmol/L — ABNORMAL LOW (ref 22–32)
CREATININE: 0.71 mg/dL (ref 0.44–1.00)
Chloride: 101 mmol/L (ref 101–111)
Glucose, Bld: 88 mg/dL (ref 65–99)
Potassium: 4.1 mmol/L (ref 3.5–5.1)
SODIUM: 129 mmol/L — AB (ref 135–145)

## 2017-07-29 MED ORDER — BENZONATATE 200 MG PO CAPS: 200.0000 mg | ORAL_CAPSULE | Freq: Three times a day (TID) | ORAL | 0 refills | Status: AC | PRN

## 2017-07-29 MED ORDER — CIPROFLOXACIN HCL 500 MG PO TABS: 500.0000 mg | ORAL_TABLET | Freq: Two times a day (BID) | ORAL | 0 refills | Status: AC

## 2017-07-29 MED ORDER — NYSTATIN 100000 UNIT/GM EX POWD: Freq: Two times a day (BID) | CUTANEOUS | 0 refills | Status: AC

## 2017-07-29 NOTE — Clinical Social Work Note (Addendum)
Patient to be d/c'ed today to [facility] room B7.  Patient and family agreeable to plans will transport via ems RN to call report [phone].  Patient's family was at bedside, and are aware of discharge back to SNF today.  Windell MouldingEric Kandace Elrod, MSW, Theresia MajorsLCSWA [phone]

## 2017-07-29 NOTE — Discharge Summary (Signed)
[facility] [place] [facility]   PATIENT NAME: Sherri Sweeney    MR#:  [id number]  DATE OF BIRTH:  12/26/1945  DATE OF ADMISSION:  07/26/2017   ADMITTING PHYSICIAN: Arnaldo NatalMichael S Diamond, MD  DATE OF DISCHARGE: 07/29/17  PRIMARY CARE PHYSICIAN: Dortha KernBliss, Laura K, MD   ADMISSION DIAGNOSIS:   Dehydration [E86.0] Hyponatremia [E87.1] Failure to thrive in adult [R62.7] Urinary tract infection without hematuria, site unspecified [N39.0]  DISCHARGE DIAGNOSIS:   Active Problems:   Hyponatremia   SECONDARY DIAGNOSIS:   Past Medical History:  Diagnosis Date  . Falling   . 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 History of convulsions   . Unspecified convulsions (HCC)   . Unsteadiness on feet     HOSPITAL COURSE:   72 year old female with past medical history significant for right-sided hemiparesis (since birth per patient), seizure disorder, GERD who is bed bound at baseline was brought in from [facility] secondary to poor oral intake and dehydration  1. Hyponatremia-hypovolemic hyponatremia. -Improved with IV fluids, discontinue her torsemide for now -f/u in 1 week again  2. Mild renal insufficiency-secondary to dehydration.  - improved now -Hold torsemide at dsicharge  3. Right-sided hemiparesis with seizure disorder-continue antiepileptics. Stable and at baseline  4. UTI- cultures growing pansensitive Escherichia coli. Received Rocephin in the hospital. Due to multiple allergies, being discharged on ciprofloxacin.   5. Cough-chest x-ray on admission did not show any acute findings.  -Could be just bronchitis or upper airway infection symptoms -Cough medications. Remains on room air  - discharge to [facility] today -Updated sister Kathie RhodesBetty yesterday    DISCHARGE CONDITIONS:   Guarded  CONSULTS OBTAINED:   None  DRUG ALLERGIES:   Allergies  Allergen Reactions  . Ampicillin Other (See Comments)    Reaction: unknown  . Ceftin [Cefuroxime Axetil] Other (See Comments)    Reaction: unknown  . Contrast Media [Iodinated Diagnostic Agents] Itching  . Keflex [Cephalexin] Other (See Comments)    Reaction: unknown  . Levaquin [Levofloxacin In D5w] Other (See Comments)    Reaction: unknown  . Nitrofurantoin Other (See Comments)    Reaction: unknown  . Septra [Sulfamethoxazole-Trimethoprim] Other (See Comments)    Reaction: unknown  . Vancomycin Other (See Comments)    Reaction: unknown  . Penicillins Itching and Rash   DISCHARGE MEDICATIONS:   Allergies as of 07/29/2017      Reactions   Ampicillin Other (See Comments)   Reaction: unknown   Ceftin [cefuroxime Axetil] Other (See Comments)   Reaction: unknown   Contrast Media [iodinated Diagnostic Agents] Itching   Keflex [cephalexin] Other (See Comments)   Reaction: unknown   Levaquin [levofloxacin In D5w] Other (See Comments)   Reaction: unknown   Nitrofurantoin Other (See Comments)   Reaction: unknown   Septra [sulfamethoxazole-trimethoprim] Other (See Comments)   Reaction: unknown   Vancomycin Other (See Comments)   Reaction: unknown   Penicillins Itching, Rash      Medication List    STOP taking these medications   potassium chloride SA 20 MEQ tablet Commonly known as:  K-DUR,KLOR-CON   torsemide 100 MG tablet Commonly known as:  DEMADEX   torsemide 20 MG tablet Commonly known as:  DEMADEX     TAKE these medications   acetaminophen 500 MG tablet Commonly known as:  TYLENOL Take 1,000 mg by mouth every 8 (eight) hours as needed.   benzonatate 200 MG capsule Commonly known as:  TESSALON  Take 1 capsule (200 mg total) by mouth 3 (three) times daily as needed for cough.   carbamazepine 200 MG tablet Commonly known as:  TEGRETOL Take 200-400 mg by mouth 3 (three) times daily. Take 200 mg by mouth morning and lunch. Take 400 mg by mouth at bedtime.   ciprofloxacin 500 MG tablet Commonly known as:  CIPRO Take 1  tablet (500 mg total) by mouth 2 (two) times daily for 5 days.   cyanocobalamin 500 MCG tablet Take 500 mcg by mouth daily.   D3-1000 1000 units capsule Generic drug:  Cholecalciferol Take 1,000 Units by mouth daily.   folic acid 1 MG tablet Commonly known as:  FOLVITE Take 1 mg by mouth daily.   guaiFENesin 100 MG/5ML liquid Commonly known as:  ROBITUSSIN Take 100 mg by mouth every 4 (four) hours as needed for cough.   guaiFENesin 600 MG 12 hr tablet Commonly known as:  MUCINEX Take 600 mg by mouth 2 (two) times daily as needed (congestion).   loperamide 2 MG capsule Commonly known as:  IMODIUM Take 2 mg by mouth as needed for diarrhea or loose stools.   loratadine 10 MG tablet Commonly known as:  CLARITIN Take 10 mg by mouth daily as needed for allergies.   meclizine 25 MG tablet Commonly known as:  ANTIVERT Take 25 mg by mouth 3 (three) times daily as needed for dizziness.   nystatin powder Commonly known as:  MYCOSTATIN/NYSTOP Apply topically 2 (two) times daily.   ondansetron 4 MG tablet Commonly known as:  ZOFRAN Take 4 mg by mouth every 6 (six) hours as needed for nausea or vomiting.   PHENObarbital 60 MG tablet Commonly known as:  LUMINAL Take 60 mg by mouth 2 (two) times daily.   promethazine 25 MG tablet Commonly known as:  PHENERGAN Take 25 mg by mouth every 6 (six) hours as needed for nausea or vomiting.   ranitidine 300 MG tablet Commonly known as:  ZANTAC Take 300 mg by mouth 2 (two) times daily.        DISCHARGE INSTRUCTIONS:   1. PCP follow-up in 1-2 weeks 2. Check BMP in 1 week  DIET:   Cardiac diet  ACTIVITY:   Activity as tolerated  OXYGEN:   Home Oxygen: No.  Oxygen Delivery: room air  DISCHARGE LOCATION:   nursing home   If you experience worsening of your admission symptoms, develop shortness of breath, life threatening emergency, suicidal or homicidal thoughts you must seek medical attention immediately by calling  911 or calling your MD immediately  if symptoms less severe.  You Must read complete instructions/literature along with all the possible adverse reactions/side effects for all the Medicines you take and that have been prescribed to you. Take any new Medicines after you have completely understood and accpet all the possible adverse reactions/side effects.   Please note  You were cared for by a hospitalist during your hospital stay. If you have any questions about your discharge medications or the care you received while you were in the hospital after you are discharged, you can call the unit and asked to speak with the hospitalist on call if the hospitalist that took care of you is not available. Once you are discharged, your primary care physician will handle any further medical issues. Please note that NO REFILLS for any discharge medications will be authorized once you are discharged, as it is imperative that you return to your primary care physician (or establish a relationship with  a primary care physician if you do not have one) for your aftercare needs so that they can reassess your need for medications and monitor your lab values.    On the day of Discharge:  VITAL SIGNS:   Blood pressure (!) 91/44, pulse 88, temperature 98.2 F (36.8 C), temperature source Oral, resp. rate 18, height 5\' 7"  (1.702 m), weight 95.4 kg (210 lb 4.8 oz), SpO2 95 %.  PHYSICAL EXAMINATION:    GENERAL:  72 y.o.-year-old patient lying in the bed with no acute distress.  EYES: Pupils equal, round, reactive to light and accommodation. No scleral icterus. Extraocular muscles intact.  HEENT: Head atraumatic, normocephalic. Oropharynx and nasopharynx clear.  NECK:  Supple, no jugular venous distention. No thyroid enlargement, no tenderness.  LUNGS: Normal breath sounds bilaterally, no wheezing, rales,rhonchi or crepitation. No use of accessory muscles of respiration.  CARDIOVASCULAR: S1, S2 normal. No murmurs, rubs,  or gallops.  ABDOMEN: Soft, nontender, nondistended. Bowel sounds present. No organomegaly or mass.  EXTREMITIES: No pedal edema, cyanosis, or clubbing.  NEUROLOGIC: Cranial nerves II through XII are intact except right facial droop. Hemiparesis of the right upper extremity and right lower extremity. Able to move left upper extremity 5/5 in left lower extremity 4/5. Sensation intact. Gait not checked.  PSYCHIATRIC: The patient is sleepy, easily arousable and then alert and oriented x 3.  SKIN: No obvious rash, lesion, or ulcer.     DATA REVIEW:   CBC Recent Labs  Lab 07/27/17 0023  WBC 8.2  HGB 13.9  HCT 40.8  PLT 249    Chemistries  Recent Labs  Lab 07/27/17 0056  07/29/17 0600  NA 126*   < > 129*  K 4.2   < > 4.1  CL 90*   < > 101  CO2 23   < > 19*  GLUCOSE 110*   < > 88  BUN 24*   < > 9  CREATININE 1.18*   < > 0.71  CALCIUM 9.0   < > 8.4*  MG 1.5*  --   --   AST 27  --   --   ALT 20  --   --   ALKPHOS 74  --   --   BILITOT 1.1  --   --    < > = values in this interval not displayed.     Microbiology Results  Results for orders placed or performed during the hospital encounter of 07/26/17  Urine Culture     Status: Abnormal (Preliminary result)   Collection Time: 07/27/17 12:23 AM  Result Value Ref Range Status   Specimen Description   Final    URINE, CATHETERIZED Performed at [facility], [address], [place], [place] [address]    Special Requests   Final    Normal Performed at [facility], [address] [place], [place] [address]    Culture (A)  Final    >=100,000 COLONIES/mL ESCHERICHIA COLI CULTURE REINCUBATED FOR BETTER GROWTH Performed at [facility], [address] [place], [place] [address]    Report Status PENDING  Incomplete   Organism ID, Bacteria ESCHERICHIA COLI (A)  Final      Susceptibility   Escherichia coli - MIC*    AMPICILLIN 16 INTERMEDIATE Intermediate     CEFAZOLIN <=4 SENSITIVE Sensitive      CEFTRIAXONE <=1 SENSITIVE Sensitive     CIPROFLOXACIN <=0.25 SENSITIVE Sensitive     GENTAMICIN <=1 SENSITIVE Sensitive  IMIPENEM <=0.25 SENSITIVE Sensitive     NITROFURANTOIN <=16 SENSITIVE Sensitive     TRIMETH/SULFA <=20 SENSITIVE Sensitive     AMPICILLIN/SULBACTAM 4 SENSITIVE Sensitive     PIP/TAZO <=4 SENSITIVE Sensitive     Extended ESBL NEGATIVE Sensitive     * >=100,000 COLONIES/mL ESCHERICHIA COLI  MRSA PCR Screening     Status: None   Collection Time: 07/27/17  4:47 AM  Result Value Ref Range Status   MRSA by PCR NEGATIVE NEGATIVE Final    Comment:        The GeneXpert MRSA Assay (FDA approved for NASAL specimens only), is one component of a comprehensive MRSA colonization surveillance program. It is not intended to diagnose MRSA infection nor to guide or monitor treatment for MRSA infections. Performed at [facility], [address], [place], [place] [address]     RADIOLOGY:  No results found.   Management plans discussed with the patient, family and they are in agreement.  CODE STATUS:     Code Status Orders  (From admission, onward)        Start     Ordered   07/27/17 0350  Full code  Continuous     07/27/17 0350    Code Status History    Date Active Date Inactive Code Status Order ID Comments User Context   07/01/2017 22:21 07/05/2017 19:52 Full Code [id number]  Shaune Pollack, MD Inpatient    Advance Directive Documentation     Most Recent Value  Type of Advance Directive  Healthcare Power of Attorney, Living will  Pre-existing out of facility DNR order (yellow form or pink MOST form)  No data  "MOST" Form in Place?  No data      TOTAL TIME TAKING CARE OF THIS PATIENT: 38 minutes.    Enid Baas M.D on 07/29/2017 at 12:43 PM  Between 7am to 6pm - Pager - [phone]  After 6pm go to [email or web address] - Social research officer, government  [facility]  Office  [phone]  CC: Primary care physician; Dortha Kern, MD   Note: This dictation was prepared with Dragon dictation along with smaller phrase technology. Any transcriptional errors that result from this process are unintentional.

## 2017-07-29 NOTE — Evaluation (Signed)
Physical Therapy Evaluation Patient Details Name: Sherri Sweeney MRN: [id number] DOB: 03/24/1946 Today's Date: 07/29/2017   History of Present Illness  Pt is a 72 yo F with past medical history of stroke with right hemiparesis who presented to the emergency department from her nursing home secondary to failure to thrive/weakness. She reported that she has not been eating well. The nursing home was concerned that she may be dehydrated. Laboratory evaluation showed hyponatremia as well as urinary tract infection. Due to the patient's general debility and potential for traumatic decline the emergency department staff requested of the hospitalist service to observe the patient for management of her comorbidities and UTI.  Assessment includes: hyponatremia, mild renal insufficiency, chronic R-sided hemiparesis, seizure disorder, and UTI.    Clinical Impression  Pt presents with deficits in strength, transfers, mobility, gait, balance, and activity tolerance.  Pt required +2 mod-max A with bed mobility tasks with extensive verbal and tactile cues for sequencing.  One in sitting at EOB pt had the tendency to lean laterally to the L but was able to correct posture with verbal cues and occasional min A.  Pt able to stand from elevated EOB with +2 min A for safety but was not able to come fully upright or to advance either LE while in standing.  Per pt and staff at [facility] pt has not ambulated for around three weeks but prior to that she was ambulatory with the use of a RW.  Pt will benefit from PT services in a SNF setting upon discharge to safely address above deficits for decreased caregiver assistance and eventual return to PLOF.      Follow Up Recommendations SNF    Equipment Recommendations  None recommended by PT    Recommendations for Other Services       Precautions / Restrictions Precautions Precautions: Fall Restrictions Weight Bearing Restrictions: No      Mobility  Bed  Mobility Overal bed mobility: Needs Assistance Bed Mobility: Supine to Sit;Sit to Supine     Supine to sit: Mod assist;+2 for physical assistance;HOB elevated Sit to supine: Max assist;+2 for physical assistance   General bed mobility comments: Pt attempts to initiate movement during bed mobility but requires extensive assist with all tasks   Transfers Overall transfer level: Needs assistance Equipment used: Rolling walker (2 wheeled) Transfers: Sit to/from Stand Sit to Stand: Min assist;+2 physical assistance         General transfer comment: Pt able to clear EOB with +2 min A for safety but was unable to stand fully upright or advance either LE.  Pt also highly anxious while in standing for fear of falling.   Ambulation/Gait             General Gait Details: Unable  Information systems managertairs            Wheelchair Mobility    Modified Rankin (Stroke Patients Only)       Balance Overall balance assessment: Needs assistance Sitting-balance support: Bilateral upper extremity supported Sitting balance-Leahy Scale: Poor Sitting balance - Comments: Pt able to maintain sitting balance with extensive verbal cues and occasional min A to prevent L lateral lean Postural control: Left lateral lean Standing balance support: Bilateral upper extremity supported Standing balance-Leahy Scale: Poor Standing balance comment: Relies heavily on BUE support on RW while in standing with flexed posture and significant fear of falling.  Pertinent Vitals/Pain Pain Assessment: No/denies pain    Home Living Family/patient expects to be discharged to:: Skilled nursing facility                 Additional Comments: Pt from SNF setting at [facility]    Prior Function Level of Independence: Needs assistance   Gait / Transfers Assistance Needed: Per staff at [facility] pt has not ambulated for 3 weeks and is now total assist including mechanical lift for  transfers.  Prior to 3 weeks ago pt was able to amb limited facility distances with a RW.  ADL's / Homemaking Assistance Needed: Pt is total assist for ADLs from SNF staff  Comments: Staff at [facility] called to gather PLOF.     Hand Dominance        Extremity/Trunk Assessment   Upper Extremity Assessment Upper Extremity Assessment: RUE deficits/detail RUE Deficits / Details: RUE strength grossly 3-/5 with chronic hemi-paresis     Lower Extremity Assessment Lower Extremity Assessment: Generalized weakness;RLE deficits/detail RLE Deficits / Details: RLE strength grossly 3/5 with chronic hemi-paresis       Communication   Communication: No difficulties  Cognition Arousal/Alertness: Awake/alert Behavior During Therapy: WFL for tasks assessed/performed Overall Cognitive Status: No family/caregiver present to determine baseline cognitive functioning                         Following Commands: Follows one step commands consistently       General Comments: Pt anxious during transfers      General Comments      Exercises Total Joint Exercises Ankle Circles/Pumps: AROM;Both;10 reps Quad Sets: Strengthening;Both;10 reps Gluteal Sets: Strengthening;Both;10 reps Hip ABduction/ADduction: AAROM;Both;10 reps Straight Leg Raises: AAROM;Both;10 reps Long Arc Quad: AROM;Both;5 reps Knee Flexion: AROM;Both;5 reps   Assessment/Plan    PT Assessment Patient needs continued PT services  PT Problem List         PT Treatment Interventions DME instruction;Gait training;Functional mobility training;Balance training;Therapeutic exercise;Patient/family education;Therapeutic activities    PT Goals (Current goals can be found in the Care Plan section)  Acute Rehab PT Goals Patient Stated Goal: To walk again PT Goal Formulation: With patient Time For Goal Achievement: 08/11/17 Potential to Achieve Goals: Fair    Frequency Min 2X/week   Barriers to discharge         Co-evaluation               AM-PAC PT "6 Clicks" Daily Activity  Outcome Measure Difficulty turning over in bed (including adjusting bedclothes, sheets and blankets)?: Unable Difficulty moving from lying on back to sitting on the side of the bed? : Unable Difficulty sitting down on and standing up from a chair with arms (e.g., wheelchair, bedside commode, etc,.)?: Unable Help needed moving to and from a bed to chair (including a wheelchair)?: Total Help needed walking in hospital room?: Total Help needed climbing 3-5 steps with a railing? : Total 6 Click Score: 6    End of Session Equipment Utilized During Treatment: Gait belt Activity Tolerance: Patient tolerated treatment well Patient left: in bed;with bed alarm set;with call bell/phone within reach Nurse Communication: Mobility status PT Visit Diagnosis: Muscle weakness (generalized) (M62.81);Difficulty in walking, not elsewhere classified (R26.2);Unsteadiness on feet (R26.81)    Time: 9604-5409 PT Time Calculation (min) (ACUTE ONLY): 24 min   Charges:   PT Evaluation $PT Eval Low Complexity: 1 Low PT Treatments $Therapeutic Exercise: 8-22 mins   PT G Codes:  Ovidio Hanger PT, DPT 07/29/17, 12:02 PM

## 2017-07-29 NOTE — Progress Notes (Signed)
Pt discharged today back to [facility] via EMS. VSS, no complaints, pt alert and oriented with increased P.O. Intake today. Family will follow EMS to facility.

## 2017-07-29 NOTE — Care Management Important Message (Signed)
Important Message  Patient Details  Name: Sherri Sweeney MRN: [id number] Date of Birth: 09/17/1945   Medicare Important Message Given:  N/A - LOS <3 / Initial given by admissions  Admission date 07/27/2017.   Eber HongGreene, Leveon Pelzer R, RN 07/29/2017, 3:17 PM

## 2017-07-31 LAB — URINE CULTURE
Culture: >=100000 — AB
Special Requests: NORMAL

## 2018-11-10 IMAGING — CT CT ABD-PELV W/O CM
2 of 4 series · 16 of 46 positions shown, 18 images · non-contrast
Comparison: None.

CLINICAL DATA: Dehydration and difficulty urinating

EXAM:
CT ABDOMEN AND PELVIS WITHOUT CONTRAST
TECHNIQUE: Multidetector CT imaging of the abdomen and pelvis was performed
following the standard protocol without IV contrast.

[Series 2: axial st · axial · 0.77mm/px · z∈[-464,+6]mm · 13 of 104 slices shown, 15 images]
[im 5/104  soft-tissue]
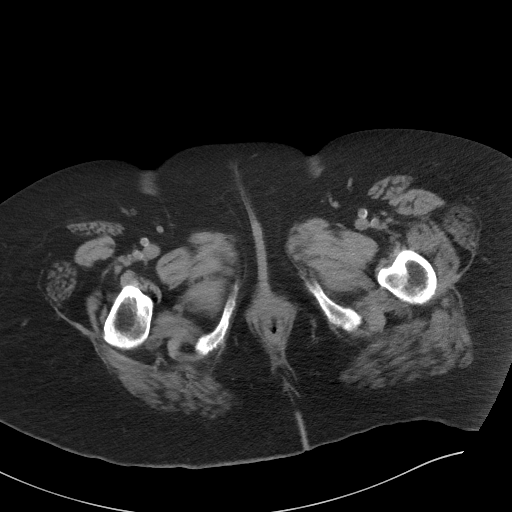
[im 5/104  bone]
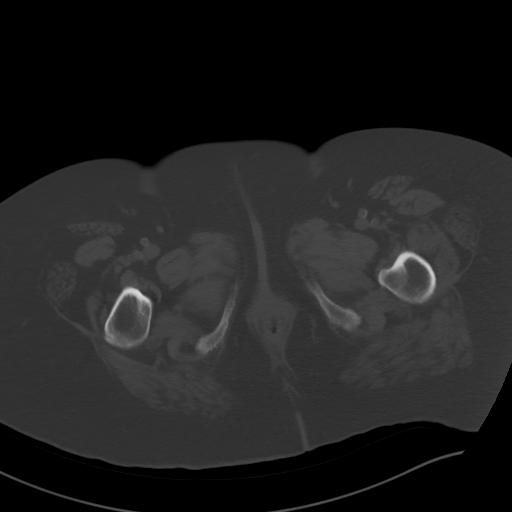
[im 13/104  soft-tissue]
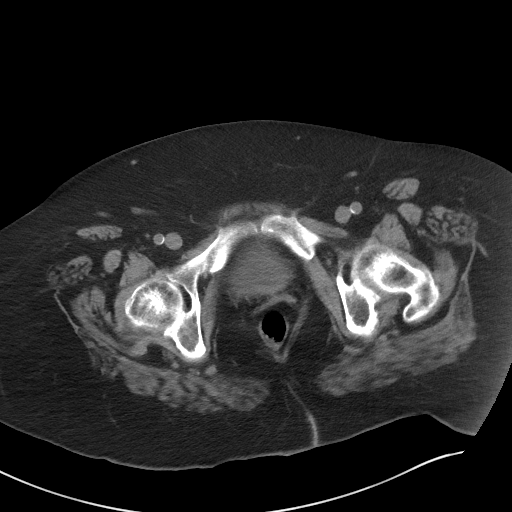
[im 22/104  soft-tissue]
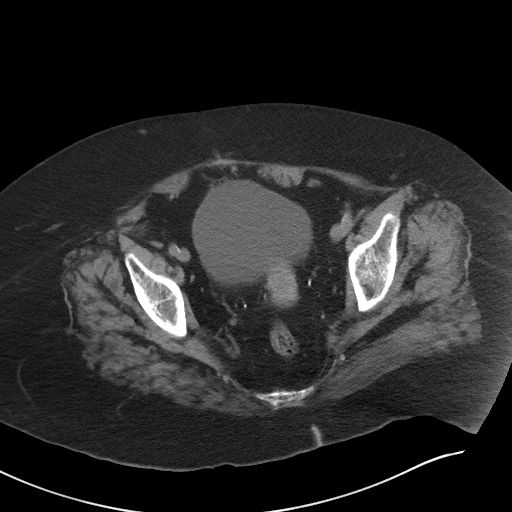
[im 31/104  soft-tissue]
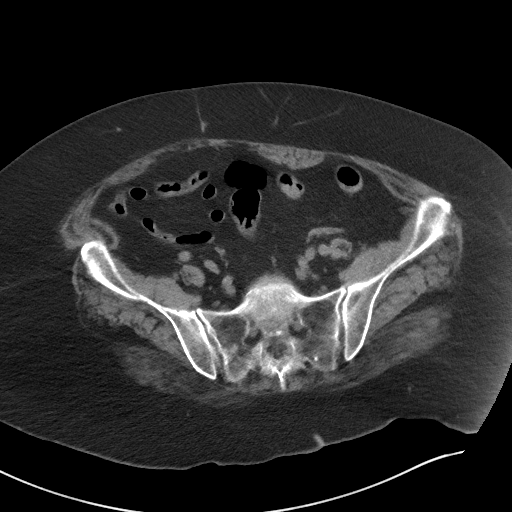
[im 35/104  soft-tissue]
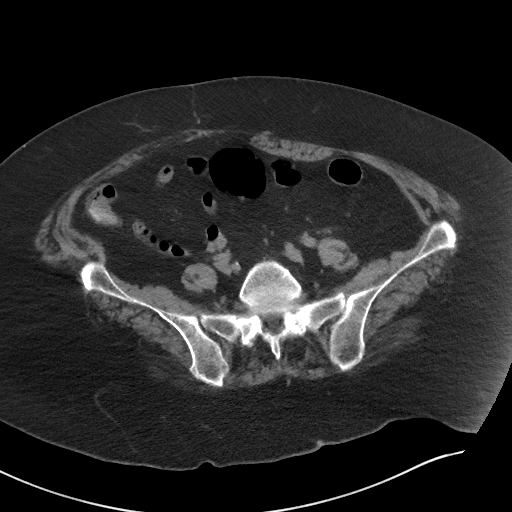
[im 43/104  soft-tissue]
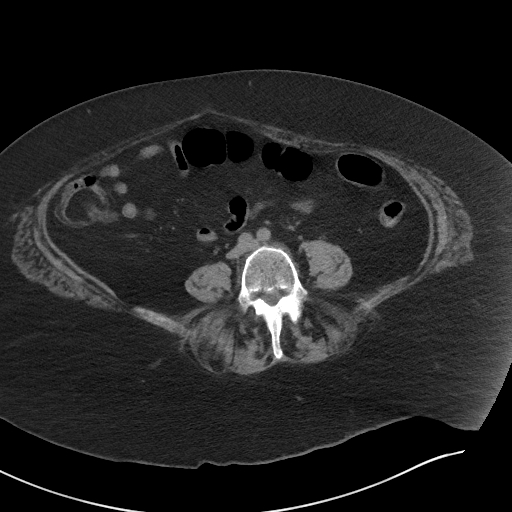
[im 52/104  soft-tissue]
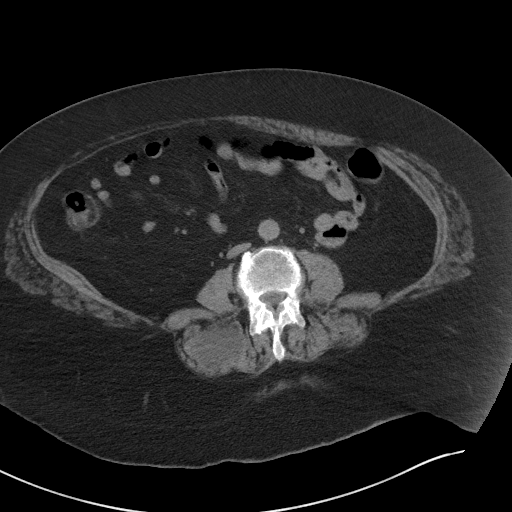
[im 61/104  soft-tissue]
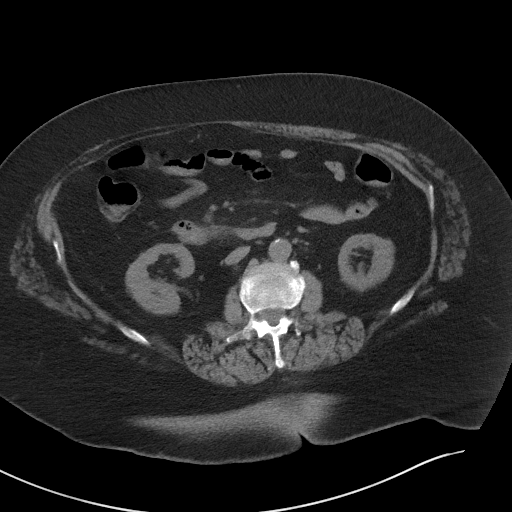
[im 69/104  soft-tissue]
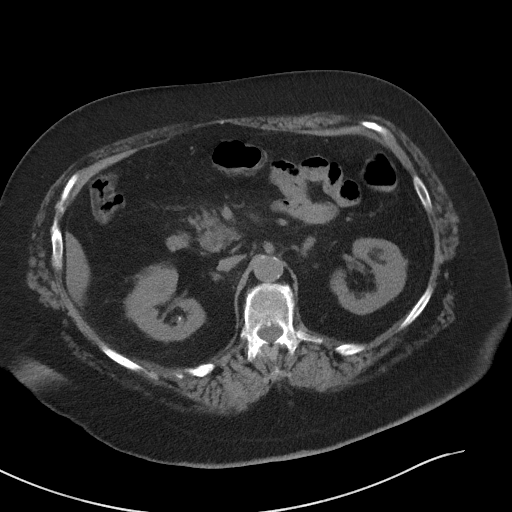
[im 69/104  bone]
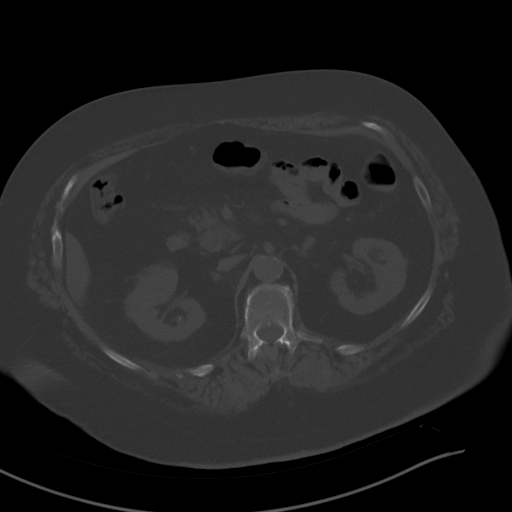
[im 73/104  soft-tissue]
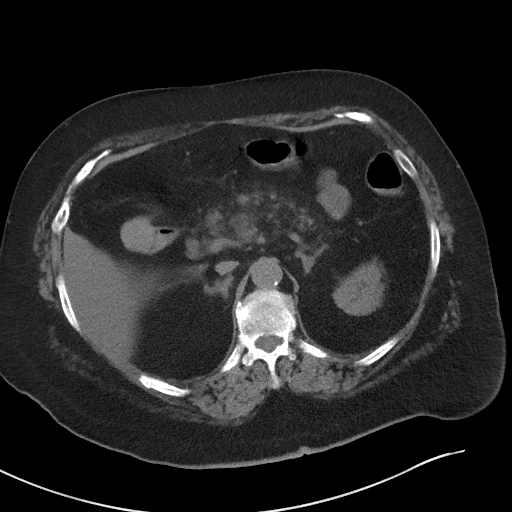
[im 82/104  soft-tissue]
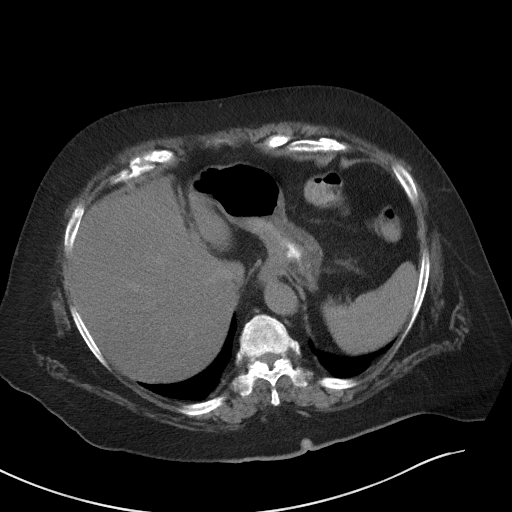
[im 91/104  soft-tissue]
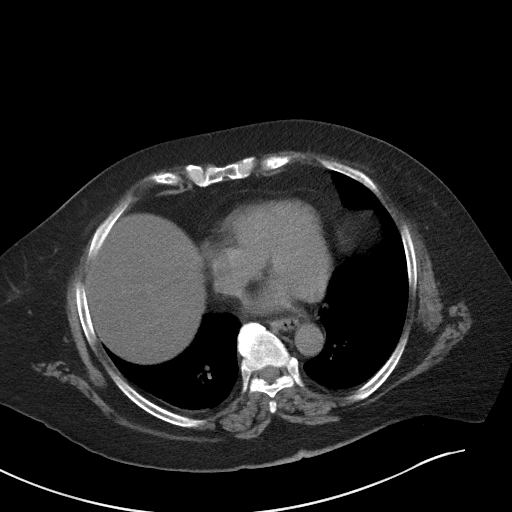
[im 99/104  soft-tissue]
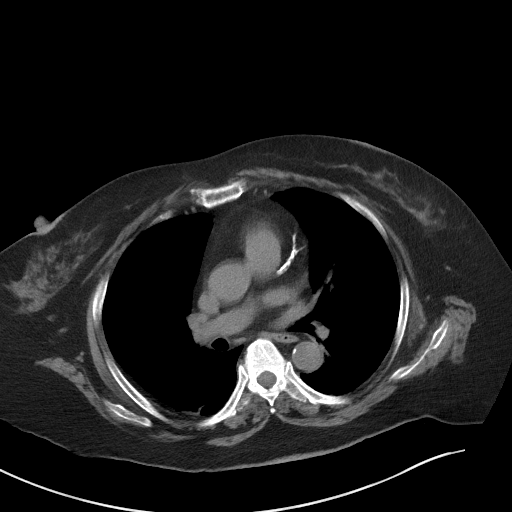

[Series 5: coronal st · coronal · 0.85mm/px · 3 of 92 slices shown]
[im 31/92  soft-tissue]
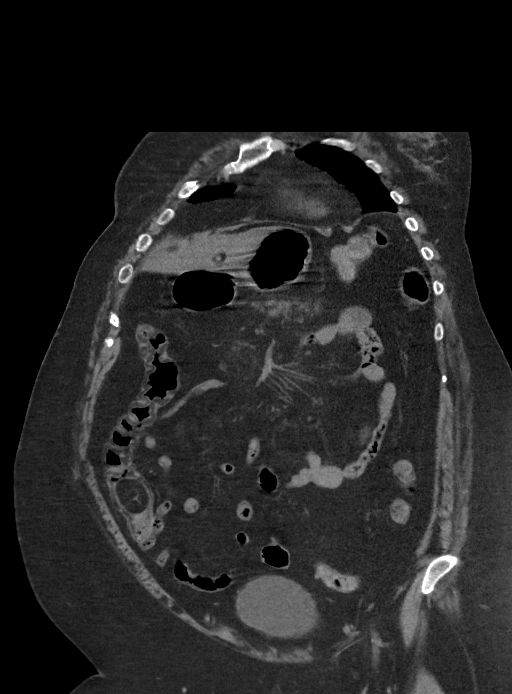
[im 41/92  soft-tissue]
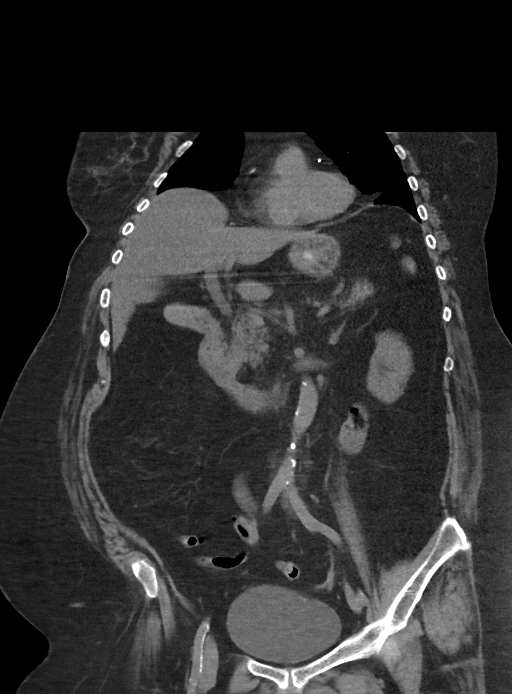
[im 51/92  soft-tissue]
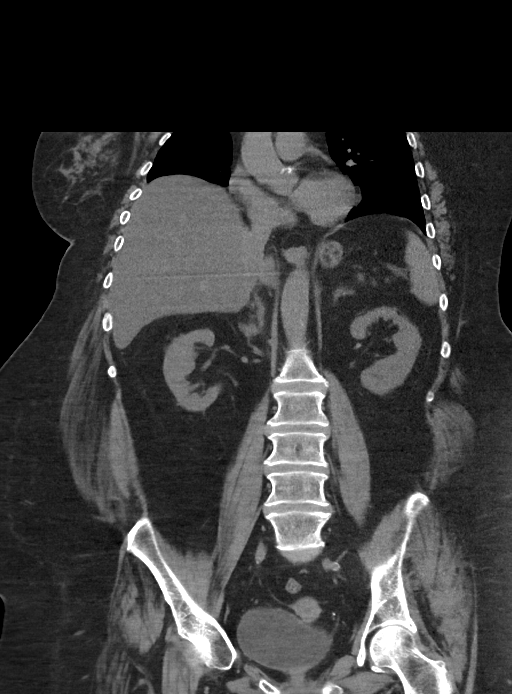

[16 of 46 positions shown; findings below may reference images not displayed]

FINDINGS: Lower chest: No acute abnormality.

Hepatobiliary: Fatty infiltration of the liver. Status post
cholecystectomy. No biliary dilatation.

Pancreas: Fatty infiltration of the pancreas is seen. No
inflammatory changes are noted.

Spleen: Normal in size without focal abnormality.

Adrenals/Urinary Tract: Adrenal glands are within normal limits.
Kidneys are well visualized bilaterally. Tiny nonobstructing right
upper pole renal stone is noted. The bladder is within normal
limits.

Stomach/Bowel: Appendix is within normal limits. No obstructive or
inflammatory changes of the bowel are seen.

Vascular/Lymphatic: Aortic atherosclerosis. No enlarged abdominal or
pelvic lymph nodes.

Reproductive: Uterus and bilateral adnexa are unremarkable.

Other: No abdominal wall hernia or abnormality. No abdominopelvic
ascites.

Musculoskeletal: Degenerative changes of the lumbar spine are noted.
IMPRESSION: Tiny nonobstructing right renal stone

Chronic changes in the liver and pancreas.

No acute abnormality noted.

## 2018-12-06 IMAGING — DX DG CHEST 1V PORT
1 series · 1 of 1 positions shown · non-contrast
Comparison: 07/05/2017

CLINICAL DATA: Failure to thrive

EXAM:
PORTABLE CHEST 1 VIEW

[chest ap]
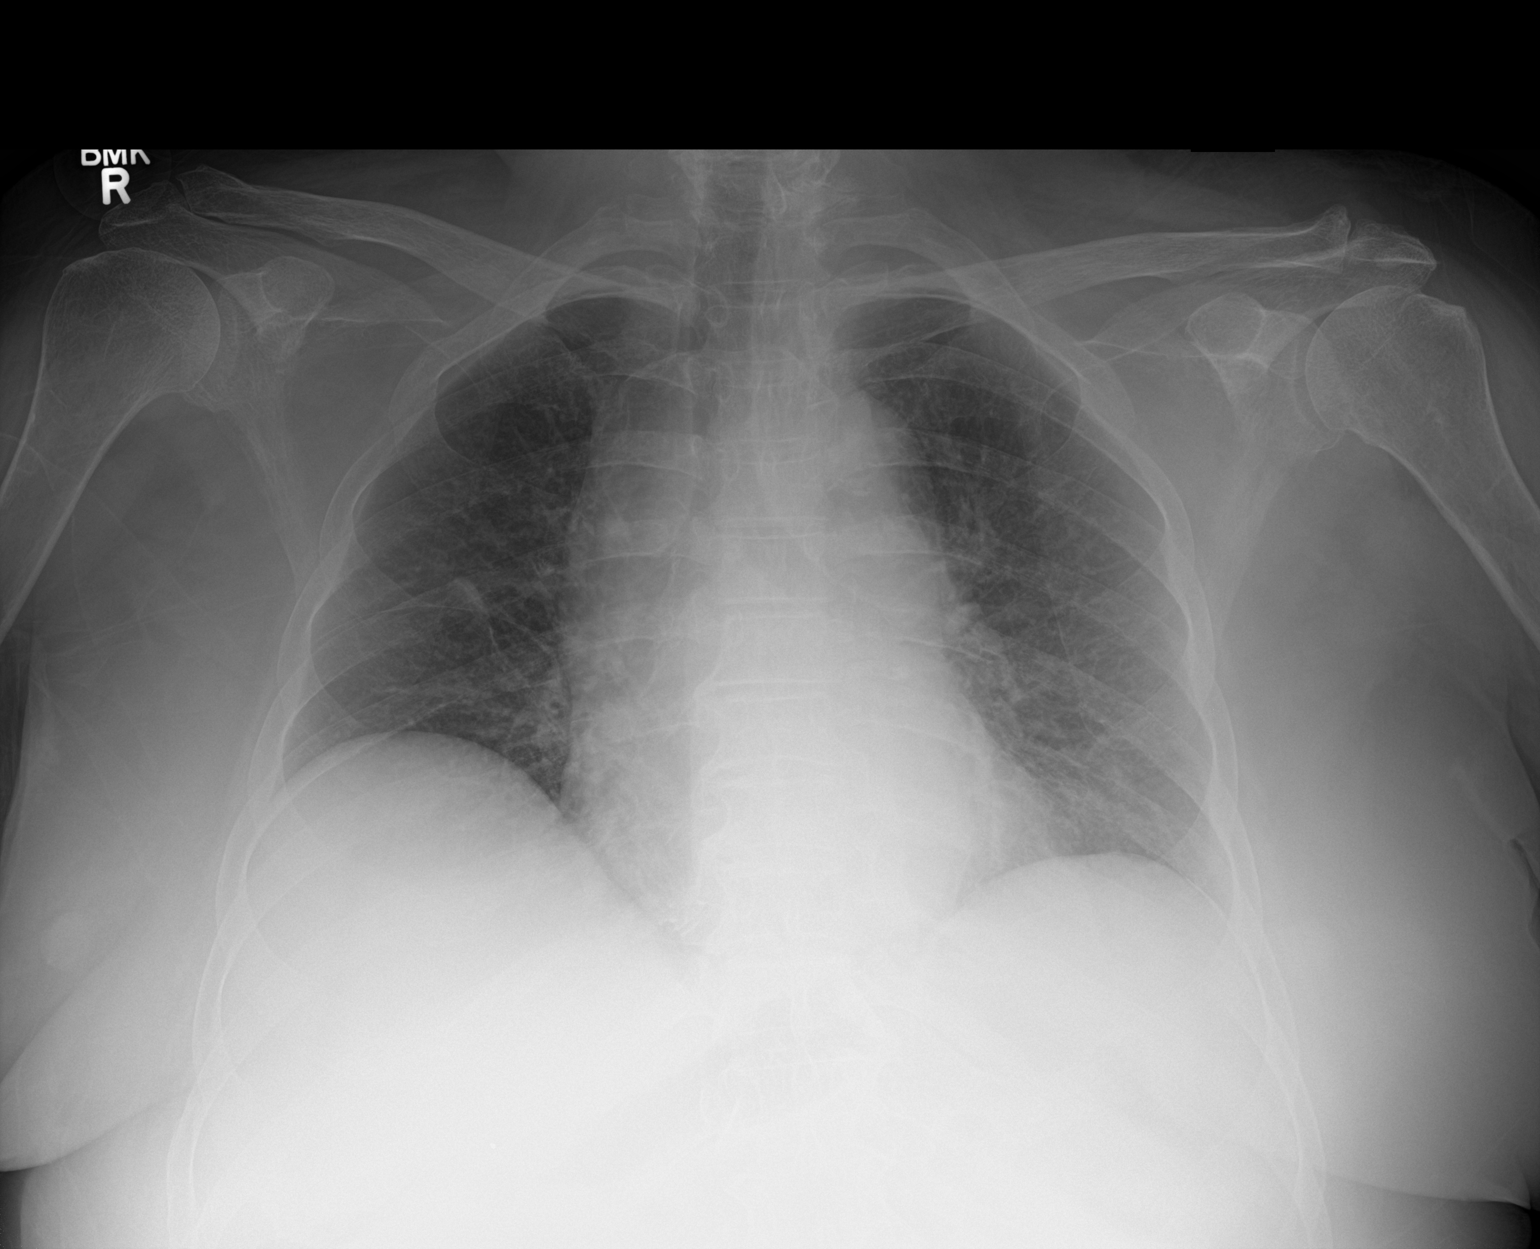

[1 of 1 positions shown; findings below may reference images not displayed]

FINDINGS: Low lung volumes. No pleural effusion. Normal heart size. No
pneumothorax. Possible oval nodule in the right mid lung.
IMPRESSION: 1. No radiographic evidence for acute cardiopulmonary abnormality.
2. Possible nodule in the right mid to lower lung. Could be
evaluated with dedicated chest CT

## 2019-12-09 ENCOUNTER — Other Ambulatory Visit: Payer: Self-pay | Admitting: Gerontology

## 2019-12-09 DIAGNOSIS — M1990 Unspecified osteoarthritis, unspecified site: Secondary | ICD-10-CM

## 2021-07-19 DEATH — deceased
